# Patient Record
Sex: Male | Born: 1955 | Hispanic: No | Marital: Married | Smoking: Never smoker
Health system: Southern US, Community
[De-identification: ages and names within clinical notes are randomized; demographics above are authoritative.]

## PROBLEM LIST (undated history)

## (undated) DIAGNOSIS — Z9889 Other specified postprocedural states: Secondary | ICD-10-CM

## (undated) DIAGNOSIS — G4730 Sleep apnea, unspecified: Secondary | ICD-10-CM

## (undated) DIAGNOSIS — I10 Essential (primary) hypertension: Secondary | ICD-10-CM

## (undated) DIAGNOSIS — L80 Vitiligo: Secondary | ICD-10-CM

## (undated) DIAGNOSIS — Z9289 Personal history of other medical treatment: Secondary | ICD-10-CM

## (undated) DIAGNOSIS — Z8619 Personal history of other infectious and parasitic diseases: Secondary | ICD-10-CM

## (undated) DIAGNOSIS — T7840XA Allergy, unspecified, initial encounter: Secondary | ICD-10-CM

## (undated) HISTORY — DX: Vitiligo: L80

## (undated) HISTORY — DX: Personal history of other infectious and parasitic diseases: Z86.19

## (undated) HISTORY — DX: Other specified postprocedural states: Z98.890

## (undated) HISTORY — DX: Essential (primary) hypertension: I10

## (undated) HISTORY — PX: OTHER SURGICAL HISTORY: SHX169

## (undated) HISTORY — DX: Personal history of other medical treatment: Z92.89

## (undated) HISTORY — PX: KNEE ARTHROSCOPY: SHX127

## (undated) HISTORY — DX: Allergy, unspecified, initial encounter: T78.40XA

## (undated) HISTORY — PX: CARDIOVASCULAR STRESS TEST: SHX262

## (undated) HISTORY — DX: Sleep apnea, unspecified: G47.30

---

## 2000-10-16 ENCOUNTER — Ambulatory Visit (HOSPITAL_BASED_OUTPATIENT_CLINIC_OR_DEPARTMENT_OTHER): Admission: RE | Admit: 2000-10-16 | Discharge: 2000-10-16 | Payer: Self-pay | Admitting: Internal Medicine

## 2003-12-22 ENCOUNTER — Ambulatory Visit: Payer: Self-pay | Admitting: Internal Medicine

## 2006-06-25 ENCOUNTER — Ambulatory Visit (HOSPITAL_BASED_OUTPATIENT_CLINIC_OR_DEPARTMENT_OTHER): Admission: RE | Admit: 2006-06-25 | Discharge: 2006-06-25 | Payer: Self-pay | Admitting: Orthopedic Surgery

## 2009-01-02 HISTORY — PX: COLONOSCOPY: SHX174

## 2009-02-11 ENCOUNTER — Encounter (INDEPENDENT_AMBULATORY_CARE_PROVIDER_SITE_OTHER): Payer: Self-pay | Admitting: *Deleted

## 2009-02-12 ENCOUNTER — Ambulatory Visit: Payer: Self-pay | Admitting: Internal Medicine

## 2009-02-26 ENCOUNTER — Ambulatory Visit: Payer: Self-pay | Admitting: Internal Medicine

## 2010-02-01 NOTE — Letter (Signed)
Summary: [facility] Instructions  [facility]  [address] [place], [place] [address]   Phone: [phone]  Fax: [phone]       PASTOR SGRO    May 10, 1955    MRN: [id number]        Procedure Day Jake Delgado:  Farrell Ours  02/26/09     Arrival Time:  3:00PM     Procedure Time:  4:00PM     Location of Procedure:                    _ X_  [facility] (4th Floor)  PREPARATION FOR COLONOSCOPY WITH MOVIPREP   Starting 5 days prior to your procedure 02/21/09 do not eat nuts, seeds, popcorn, corn, beans, peas,  salads, or any raw vegetables.  Do not take any fiber supplements (e.g. Metamucil, Citrucel, and Benefiber).  THE DAY BEFORE YOUR PROCEDURE         DATE: 02/25/09 DAY: THURSDAY  1.  Drink clear liquids the entire day-NO SOLID FOOD  2.  Do not drink anything colored red or purple.  Avoid juices with pulp.  No orange juice.  3.  Drink at least 64 oz. (8 glasses) of fluid/clear liquids during the day to prevent dehydration and help the prep work efficiently.  CLEAR LIQUIDS INCLUDE: Water Jello Ice Popsicles Tea (sugar ok, no milk/cream) Powdered fruit flavored drinks Coffee (sugar ok, no milk/cream) Gatorade Juice: apple, white grape, white cranberry  Lemonade Clear bullion, consomm, broth Carbonated beverages (any kind) Strained chicken noodle soup Hard Candy                             4.  In the morning, mix first dose of MoviPrep solution:    Empty 1 Pouch A and 1 Pouch B into the disposable container    Add lukewarm drinking water to the top line of the container. Mix to dissolve    Refrigerate (mixed solution should be used within 24 hrs)  5.  Begin drinking the prep at 5:00 p.m. The MoviPrep container is divided by 4 marks.   Every 15 minutes drink the solution down to the next mark (approximately 8 oz) until the full liter is complete.   6.  Follow completed prep with 16 oz of clear liquid of your choice (Nothing red or purple).   Continue to drink clear liquids until bedtime.  7.  Before going to bed, mix second dose of MoviPrep solution:    Empty 1 Pouch A and 1 Pouch B into the disposable container    Add lukewarm drinking water to the top line of the container. Mix to dissolve    Refrigerate  THE DAY OF YOUR PROCEDURE      DATE: 02/26/09   DAY: FRIDAY  Beginning at 11:00AM (5 hours before procedure):         1. Every 15 minutes, drink the solution down to the next mark (approx 8 oz) until the full liter is complete.  2. Follow completed prep with 16 oz. of clear liquid of your choice.    3. You may drink clear liquids until 2:00PM (2 HOURS BEFORE PROCEDURE).   MEDICATION INSTRUCTIONS  Unless otherwise instructed, you should take regular prescription medications with a small sip of water   as early as possible the morning of your procedure.         OTHER INSTRUCTIONS  You will need a responsible adult at least  53 years of age to accompany you and drive you home.   This person must remain in the waiting room during your procedure.  Wear loose fitting clothing that is easily removed.  Leave jewelry and other valuables at home.  However, you may wish to bring a book to read or  an iPod/MP3 player to listen to music as you wait for your procedure to start.  Remove all body piercing jewelry and leave at home.  Total time from sign-in until discharge is approximately 2-3 hours.  You should go home directly after your procedure and rest.  You can resume normal activities the  day after your procedure.  The day of your procedure you should not:   Drive   Make legal decisions   Operate machinery   Drink alcohol   Return to work  You will receive specific instructions about eating, activities and medications before you leave.    The above instructions have been reviewed and explained to me by   Wyona Almas RN  February 12, 2009 5:00 PM     I fully understand and can verbalize  these instructions _____________________________ Date _________

## 2010-02-01 NOTE — Procedures (Signed)
Summary: Colonoscopy  Patient: Jenesis Suchy Note: All result statuses are Final unless otherwise noted.  Tests: (1) Colonoscopy (COL)   COL Colonoscopy           DONE     [facility]     [address]     [place], [place]  [address]           COLONOSCOPY PROCEDURE REPORT           PATIENT:  Shreyas, Piatkowski  MR#:  [id number]     BIRTHDATE:  [birth year], 53 yrs. old  GENDER:  male           ENDOSCOPIST:  Hedwig Morton. Juanda Chance, MD     Referred by:  Eleonore Chiquito, M.D.           PROCEDURE DATE:  02/26/2009     PROCEDURE:  Colonoscopy 96[address]     ASA CLASS:  Class I     INDICATIONS:  Routine Risk Screening           MEDICATIONS:   Versed 9 mg, Fentanyl 75 mcg           DESCRIPTION OF PROCEDURE:   After the risks benefits and     alternatives of the procedure were thoroughly explained, informed     consent was obtained.  Digital rectal exam was performed and     revealed no rectal masses.   The LB CF-[id number] endoscope     was introduced through the anus and advanced to the cecum, which     was identified by both the appendix and ileocecal valve, without     limitations.  The quality of the prep was excellent, using     MoviPrep.  The instrument was then slowly withdrawn as the colon     was fully examined.     <<PROCEDUREIMAGES>>           FINDINGS:  No polyps or cancers were seen (see image1, image2,     image3, image4, image5, and image6).   Retroflexed views in the     rectum revealed no abnormalities.    The scope was then withdrawn     from the patient and the procedure completed.           COMPLICATIONS:  None           ENDOSCOPIC IMPRESSION:     1) No polyps or cancers     2) Normal colonoscopy     RECOMMENDATIONS:     1) High fiber diet.           REPEAT EXAM:  In 10 year(s) for.           ______________________________     Hedwig Morton. Juanda Chance, MD           CC:           n.     eSIGNED:   Hedwig Morton. Brodie at 02/26/2009 05:05 PM           Darryll Capers,  [id number]  Note: An exclamation mark (!) indicates a result that was not dispersed into the flowsheet. Document Creation Date: 02/26/2009 5:05 PM _______________________________________________________________________  (1) Order result status: Final Collection or observation date-time: 02/26/2009 17:00 Requested date-time:  Receipt date-time:  Reported date-time:  Referring Physician:   Ordering Physician: Lina Sar [phone]) Specimen Source:  Source: Launa Grill Order Number: [id number] Lab site:   Appended Document: Colonoscopy    Clinical Lists Changes  Observations: Added  new observation of COLONNXTDUE: 02/2019 (02/26/2009 17:08)

## 2010-02-01 NOTE — Miscellaneous (Signed)
Summary: [facility] Previsit/prep  Clinical Lists Changes  Medications: Added new medication of MOVIPREP 100 GM  SOLR (PEG-KCL-NACL-NASULF-NA ASC-C) As per prep instructions. - Signed Rx of MOVIPREP 100 GM  SOLR (PEG-KCL-NACL-NASULF-NA ASC-C) As per prep instructions.;  #1 x 0;  Signed;  Entered by: Wyona Almas RN;  Authorized by: Hart Carwin MD;  Method used: Electron[facility] to [facility]  [phone]*, [address], [place], [place]  [address], Ph: [phone] or [phone], Fax: [phone] Allergies: Added new allergy or adverse reaction of IBUPROFEN Observations: Added new observation of NKA: F (02/12/2009 16:23)    Prescriptions: MOVIPREP 100 GM  SOLR (PEG-KCL-NACL-NASULF-NA ASC-C) As per prep instructions.  #1 x 0   Entered by:   Wyona Almas RN   Authorized by:   Hart Carwin MD   Signed by:   Wyona Almas RN on 02/12/2009   Method used:   [facility]ronically to        [phone] (retail)       [address] [place], [place]  [address]       Ph: [phone] or [phone]       Fax: [phone]   RxID:   [id number]

## 2010-05-17 NOTE — Op Note (Signed)
NAMELORANCE, PICKERAL                ACCOUNT NO.:  [id number]   MEDICAL RECORD NO.:  [id number]          PATIENT TYPE:  AMB   LOCATION:  [facility]                         FACILITY:  [facility]   PHYSICIAN:  Ollen Gross, M.D.    DATE OF BIRTH:  Aug 04, 1955   DATE OF PROCEDURE:  06/25/2006  DATE OF DISCHARGE:                               OPERATIVE REPORT   PREOPERATIVE DIAGNOSIS:  Left knee medial and lateral meniscal tears.   POSTOPERATIVE DIAGNOSIS:  Left knee medial and lateral meniscal tears.   PROCEDURE:  Left knee arthroscopy with medial and lateral meniscal  debridement.   SURGEON:  Dr. Lequita Halt, no assistant.   ANESTHESIA:  General.   BLOOD LOSS:  Minimal.   DRAIN:  None.   COMPLICATION:  None.   CONDITION:  Stable to recovery room.   CLINICAL NOTE:  Jake Delgado is a 55 year old male with close to 1-year  history of pain, left knee with significant mechanical symptoms.  Exam  and history suggest meniscal tear confirmed by MRI.  Presents now for  arthroscopy and debridement.   PROCEDURE IN DETAIL:  After successful administration of general  anesthetic, a tourniquet was placed high on the left thigh and left  lower extremity prepped and draped in the usual sterile fashion.  Standard superomedial and inferolateral incisions made. In flow cannula  passed superomedial and camera passed inferolateral.  Arthroscopic  visualization proceeds.  Undersurface of the patella and trochlea look  fine.  Medial and lateral gutters were visualized and there were no  loose bodies.  Upon getting down to the base of the medial gutter there  was a piece of meniscus that was flipped out of the joint and into the  medial gutter consistent with a bucket-handle tear.  Flexion valgus  force applied to the knee and medial compartment entered.  There was  definitely evidence of tear in the body and posterior horn of medial  meniscus with a bucket-handle fragment flipped out of the joint into the  medial  gutter.  There were no chondral defects.  Spinal needle is used  to localize the inferomedial portal, small incision made, dilator placed  and the meniscus debrided back to stable base with baskets and a 4.2 mm  shaver and sealed off with the ArthroCare device.  The chondral surfaces  are again inspected.  No defects noted.   Intercondylar notch is visualized and has a fair amount of synovitis.  ACL looks normal.  Lateral compartment entered, there is a significant  tear in the body and anterior aspect of the lateral meniscus going all  the way back to the posterior horn.  This debrided back to stable base  with baskets, 4.2 mm shaver and sealed off with the ArthroCare device.  There were no chondral defects in the lateral compartment.  The rest of  the joint again visualized.  No other tears or loose bodies noted.  The  arthroscopic equipment was removed from the inferior portals  which were closed with interrupted 4-0 nylon.  20 mL of 0.25% Marcaine  with epinephrine injected through the inflow cannula  and that is removed  and that portal closed with nylon.  Bulky sterile dressing applied and  he is awakened and transferred to recovery in stable condition.      Ollen Gross, M.D.  Electronically Signed     FA/MEDQ  D:  06/25/2006  T:  06/25/2006  Job:  [id number]

## 2010-10-19 LAB — POCT HEMOGLOBIN-HEMACUE: Hemoglobin: 16.1

## 2013-01-02 DIAGNOSIS — Z9289 Personal history of other medical treatment: Secondary | ICD-10-CM

## 2013-01-02 HISTORY — DX: Personal history of other medical treatment: Z92.89

## 2013-06-06 ENCOUNTER — Encounter (HOSPITAL_COMMUNITY): Payer: Self-pay | Admitting: Emergency Medicine

## 2013-06-06 ENCOUNTER — Emergency Department (HOSPITAL_COMMUNITY)
Admission: EM | Admit: 2013-06-06 | Discharge: 2013-06-07 | Disposition: A | Discharge status: Home / Self Care | Payer: BC Managed Care – PPO | Attending: Emergency Medicine | Admitting: Emergency Medicine

## 2013-06-06 DIAGNOSIS — S239XXA Sprain of unspecified parts of thorax, initial encounter: Secondary | ICD-10-CM | POA: Insufficient documentation

## 2013-06-06 DIAGNOSIS — X500XXA Overexertion from strenuous movement or load, initial encounter: Secondary | ICD-10-CM | POA: Insufficient documentation

## 2013-06-06 DIAGNOSIS — J159 Unspecified bacterial pneumonia: Secondary | ICD-10-CM | POA: Insufficient documentation

## 2013-06-06 DIAGNOSIS — J189 Pneumonia, unspecified organism: Secondary | ICD-10-CM

## 2013-06-06 DIAGNOSIS — Y9389 Activity, other specified: Secondary | ICD-10-CM | POA: Insufficient documentation

## 2013-06-06 DIAGNOSIS — Z79899 Other long term (current) drug therapy: Secondary | ICD-10-CM | POA: Insufficient documentation

## 2013-06-06 DIAGNOSIS — Y929 Unspecified place or not applicable: Secondary | ICD-10-CM | POA: Insufficient documentation

## 2013-06-06 DIAGNOSIS — IMO0002 Reserved for concepts with insufficient information to code with codable children: Secondary | ICD-10-CM

## 2013-06-06 MED ORDER — PREDNISONE 20 MG PO TABS: 60.0000 mg | ORAL_TABLET | Freq: Once | ORAL | Status: DC

## 2013-06-06 MED ORDER — HYDROMORPHONE HCL PF 1 MG/ML IJ SOLN
1.0000 mg | Freq: Once | INTRAMUSCULAR | Status: AC
  Administered 2013-06-06: 1 mg via INTRAVENOUS
  Filled 2013-06-06: qty 1

## 2013-06-06 MED ORDER — PREDNISONE 20 MG PO TABS
60.0000 mg | ORAL_TABLET | Freq: Once | ORAL | Status: AC
  Administered 2013-06-06: 60 mg via ORAL
  Filled 2013-06-06: qty 3

## 2013-06-06 MED ORDER — HYDROMORPHONE HCL PF 1 MG/ML IJ SOLN: 1.0000 mg | Freq: Once | INTRAMUSCULAR | Status: DC

## 2013-06-06 MED ORDER — DIAZEPAM 5 MG PO TABS
5.0000 mg | ORAL_TABLET | Freq: Once | ORAL | Status: AC
  Administered 2013-06-06: 5 mg via ORAL
  Filled 2013-06-06: qty 1

## 2013-06-06 NOTE — ED Notes (Signed)
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 Pt c/o spasms today.

## 2013-06-06 NOTE — ED Provider Notes (Signed)
CSN: [id number]     Arrival date & time 06/06/13  2224 History  This chart was scribed for Ebbie Ridge, PA working with Enid Skeens, MD by Chestine Spore, ED Scribe. The patient was seen in room WTR9/WTR9 at 11:01 PM.     Chief Complaint  Patient presents with  . Back Pain    The history is provided by a relative. No language interpreter was used.   HPI Comments: Jake Delgado is a 58 y.o. male who presents to the Emergency Department complaining of worsening left lower back pain that began 5-6 days ago. He states that he developed moderate pain after he did heavy lifting. He took [place] with no aid for his pain. He visited [facility] earlier today and had a X-ray conducted and was advised to scheduled a CT because of questionable mass.  After being seen there his pain worsened and he developed severe muscle spasms.  Pt has associated symptoms of SOB because of the muscle spasms but he denies any other SOB.   History reviewed. No pertinent past medical history.  Past Surgical History  Procedure Laterality Date  . Cardiovascular stress test    . Knee arthroscopy     No family history on file. History  Substance Use Topics  . Smoking status: Never Smoker   . Smokeless tobacco: Not on file  . Alcohol Use: No    Review of Systems A complete 10 system review of systems was obtained and all systems are negative except as noted in the HPI and PMH.     Allergies  Ansaid and Ibuprofen  Home Medications   Prior to Admission medications   Medication Sig Start Date End Date Taking? Authorizing Provider  acetaminophen (TYLENOL) 500 MG tablet Take 1,000 mg by mouth every 6 (six) hours as needed for moderate pain.   Yes Historical Provider, MD  cetirizine (ZYRTEC) 10 MG tablet Take 10 mg by mouth daily.   Yes Historical Provider, MD   BP 155/92  Pulse 82  Temp(Src) 100 F (37.8 C) (Oral)  Resp 21  SpO2 95% Physical Exam  Nursing note and vitals  reviewed. Constitutional: He is oriented to person, place, and time. He appears well-developed and well-nourished. No distress.  HENT:  Head: Normocephalic and atraumatic.  Mouth/Throat: Oropharynx is clear and moist.  Eyes: EOM are normal.  Neck: Normal range of motion. Neck supple. No tracheal deviation present.  Cardiovascular: Normal rate, regular rhythm and normal heart sounds.   No murmur heard. Pulmonary/Chest: Effort normal and breath sounds normal. No respiratory distress. He has no wheezes. He has no rales. He exhibits tenderness.  Pain on palpation to the left Thoracic and Lateral chest wall.  Abdominal: Soft. Bowel sounds are normal. He exhibits no distension. There is no tenderness.  Musculoskeletal: Normal range of motion. He exhibits no edema.  Neurological: He is alert and oriented to person, place, and time. He exhibits normal muscle tone. Coordination normal.  Skin: Skin is warm and dry. No rash noted. No erythema.  Psychiatric: He has a normal mood and affect. His behavior is normal.    ED Course  Procedures (including critical care time) DIAGNOSTIC STUDIES: Oxygen Saturation is 95% on room air, adequate by my interpretation.    COORDINATION OF CARE: 11:05 PM-Discussed treatment plan which includes CT scan of the affected area with pt at bedside and pt agreed to plan.   Labs Review Labs Reviewed  CBC WITH DIFFERENTIAL - Abnormal; Notable for the  following:    RBC 6.28 (*)    MCV 75.0 (*)    MCH 25.3 (*)    All other components within normal limits  I-STAT CHEM 8, ED - Abnormal; Notable for the following:    Glucose, Bld 173 (*)    Hemoglobin 18.0 (*)    HCT 53.0 (*)    All other components within normal limits  URINALYSIS, ROUTINE W REFLEX MICROSCOPIC    Imaging Review Ct Chest W Contrast  06/07/2013   CLINICAL DATA:  Chest pain.  Abnormal chest x-ray.  EXAM: CT CHEST WITH CONTRAST  TECHNIQUE: Multidetector CT imaging of the chest was performed during  intravenous contrast administration.  CONTRAST:  100mL OMNIPAQUE IOHEXOL 300 MG/ML  SOLN  COMPARISON:  None.  FINDINGS: The chest wall is unremarkable. No masses or adenopathy. The thyroid gland appears normal. The bony thorax is intact. No destructive bone lesions or spinal canal compromise.  The heart is normal in size. No pericardial effusion. No mediastinal or hilar mass or adenopathy. The thoracic aorta demonstrates mild tortuosity and ectasia but no focal aneurysm or dissection. No dissection. The esophagus is grossly normal.  Examination of the lung parenchyma demonstrates bilateral infiltrates, left greater than right. There are bilateral lower lobe, lingular and right middle lobe infiltrates and a small left effusion. No worrisome pulmonary lesions. The tracheobronchial tree is unremarkable.  The upper abdomen is unremarkable.  IMPRESSION: Patchy bilateral lung infiltrates.   Electronically Signed   By: Loralie ChampagneMark  Gallerani M.D.   On: 06/07/2013 01:12   Patient will be advised followup with his primary care Dr. told to return here as, needed.  We'll get pain control for further told her symptoms.  Patient also be treated for possible early pneumonia based on his CT scan the patient is advised of the results.  All questions were answered.  Patient is feeling improved at this time.  Patient is advised to use ice and heat on his back and chest.  Patient most likely has a thoracic strain with spasm, based on his findings of heavy lifting that started the pain      I personally performed the services described in this documentation, which was scribed in my presence. The recorded information has been reviewed and is accurate.   Carlyle Dollyhristopher W Molina Hollenback, PA-C 06/07/13 [phone]

## 2013-06-07 ENCOUNTER — Emergency Department (HOSPITAL_COMMUNITY): Payer: BC Managed Care – PPO

## 2013-06-07 ENCOUNTER — Ambulatory Visit (HOSPITAL_COMMUNITY): Admission: RE | Admit: 2013-06-07 | Payer: BC Managed Care – PPO | Source: Ambulatory Visit

## 2013-06-07 ENCOUNTER — Ambulatory Visit (HOSPITAL_COMMUNITY)
Admission: RE | Admit: 2013-06-07 | Discharge: 2013-06-07 | Disposition: A | Discharge status: Home / Self Care | Payer: BC Managed Care – PPO | Source: Ambulatory Visit | Attending: Emergency Medicine | Admitting: Emergency Medicine

## 2013-06-07 LAB — CBC WITH DIFFERENTIAL/PLATELET
BASOS ABS: 0 10*3/uL (ref 0.0–0.1)
Basophils Relative: 0 % (ref 0–1)
EOS PCT: 1 % (ref 0–5)
Eosinophils Absolute: 0.1 10*3/uL (ref 0.0–0.7)
HEMATOCRIT: 47.1 % (ref 39.0–52.0)
HEMOGLOBIN: 15.9 g/dL (ref 13.0–17.0)
Lymphocytes Relative: 20 % (ref 12–46)
Lymphs Abs: 1.8 10*3/uL (ref 0.7–4.0)
MCH: 25.3 pg — ABNORMAL LOW (ref 26.0–34.0)
MCHC: 33.8 g/dL (ref 30.0–36.0)
MCV: 75 fL — AB (ref 78.0–100.0)
MONO ABS: 0.8 10*3/uL (ref 0.1–1.0)
Monocytes Relative: 9 % (ref 3–12)
Neutro Abs: 6.2 10*3/uL (ref 1.7–7.7)
Neutrophils Relative %: 70 % (ref 43–77)
Platelets: 161 10*3/uL (ref 150–400)
RBC: 6.28 MIL/uL — ABNORMAL HIGH (ref 4.22–5.81)
RDW: 14.4 % (ref 11.5–15.5)
WBC: 8.9 10*3/uL (ref 4.0–10.5)

## 2013-06-07 LAB — URINALYSIS, ROUTINE W REFLEX MICROSCOPIC
Bilirubin Urine: NEGATIVE
Glucose, UA: NEGATIVE mg/dL
Hgb urine dipstick: NEGATIVE
KETONES UR: NEGATIVE mg/dL
Leukocytes, UA: NEGATIVE
NITRITE: NEGATIVE
PH: 6 (ref 5.0–8.0)
Protein, ur: NEGATIVE mg/dL
Specific Gravity, Urine: 1.021 (ref 1.005–1.030)
Urobilinogen, UA: 0.2 mg/dL (ref 0.0–1.0)

## 2013-06-07 LAB — I-STAT CHEM 8, ED
BUN: 21 mg/dL (ref 6–23)
Calcium, Ion: 1.18 mmol/L (ref 1.12–1.23)
Chloride: 99 mEq/L (ref 96–112)
Creatinine, Ser: 1 mg/dL (ref 0.50–1.35)
Glucose, Bld: 173 mg/dL — ABNORMAL HIGH (ref 70–99)
HCT: 53 % — ABNORMAL HIGH (ref 39.0–52.0)
Hemoglobin: 18 g/dL — ABNORMAL HIGH (ref 13.0–17.0)
Potassium: 4.2 mEq/L (ref 3.7–5.3)
Sodium: 140 mEq/L (ref 137–147)
TCO2: 24 mmol/L (ref 0–100)

## 2013-06-07 MED ORDER — IOHEXOL 300 MG/ML  SOLN
100.0000 mL | Freq: Once | INTRAMUSCULAR | Status: AC | PRN
  Administered 2013-06-07: 100 mL via INTRAVENOUS

## 2013-06-07 MED ORDER — PREDNISONE 50 MG PO TABS: 50.0000 mg | ORAL_TABLET | Freq: Every day | ORAL | Status: DC

## 2013-06-07 MED ORDER — DOXYCYCLINE HYCLATE 100 MG PO CAPS: 100.0000 mg | ORAL_CAPSULE | Freq: Two times a day (BID) | ORAL | Status: DC

## 2013-06-07 MED ORDER — DIAZEPAM 5 MG PO TABS: 5.0000 mg | ORAL_TABLET | Freq: Three times a day (TID) | ORAL | Status: DC | PRN

## 2013-06-07 MED ORDER — OXYCODONE-ACETAMINOPHEN 5-325 MG PO TABS: 1.0000 | ORAL_TABLET | ORAL | Status: DC | PRN

## 2013-06-07 NOTE — ED Notes (Signed)
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  Became real severe today with very limited movement.

## 2013-06-07 NOTE — ED Provider Notes (Signed)
Medical screening examination/treatment/procedure(s) were conducted as a shared visit with non-physician practitioner(s) or resident and myself. I personally evaluated the patient during the encounter and agree with the findings and plan unless otherwise indicated.  I have personally reviewed any xrays and/ or EKG's with the provider and I agree with interpretation.  Patient with no significant medical history, recent normal stress test presents with worsening left flank pain and back spasms for the past 5 or 6 days. Patient was lifting heavy computer equipment prior to starting however has gradually worsened since.Patient denies urinary or bowel changes, active cancer, extremity weakness, IVDU, fevers, immunosuppression or significant trauma. Worse with palpation any range of motion. The deep left flank pain. Patient has tried over-the-counter medicines with minimal improvement. Worse with walking. On exam patient has tenderness in the left flank paraspinal location however unable to reproduce pain. Muscles are tight paraspinal. Patient has 5+ and lower extremities bilateral with flexion extension of hips knees and great toes. Normal lower extremity reflexes. Normal sensation lower exam is bilateral. With worsening symptoms indeed flank pain plan for CT look for stone, AAA or other etiology. Pain medicines and urine.   CT chest reviewed and bilateral lower infiltrates. Patient denies significant cough however this may explain his flank pain for pneumonia. I called the patient back to check on how is doing he does feel improved. I did asked him to come back to the ER to have an ultrasound or CT scan of the abdomen to ensure no abdominal aortic aneurysm. The wife said that she would bring him back later to complete the workup to rule out abdominal aortic aneurysm. Left flank pain, back pain   Enid Skeens, MD 06/07/13 [phone]

## 2013-06-07 NOTE — ED Provider Notes (Addendum)
Pt back for u/s or CT of abd to assess for AAA per Dr Jodi Mourning to complete his workup from last night.  Denies any pain, vomiting.  Feeling much better than last night, will check u/s as outpatient order rather than checking him back in to ED as he is asymptomatic now.  16:17: no evidence of AAA, aorta at upper limits of normal, spoke with pt's wife and advised that he needs f/u u/s in about 5 years per recommendations.   Rolan Bucco, MD 06/07/13 1057  Rolan Bucco, MD 06/07/13 [phone]

## 2013-06-07 NOTE — ED Notes (Signed)
Patient transported to CT 

## 2013-06-07 NOTE — Discharge Instructions (Signed)
Return here as needed.  Followup with a primary care Dr. or urgent care.  Use ice and heat on your back & side.

## 2013-07-09 ENCOUNTER — Encounter: Payer: Self-pay | Admitting: Internal Medicine

## 2013-07-09 ENCOUNTER — Ambulatory Visit (INDEPENDENT_AMBULATORY_CARE_PROVIDER_SITE_OTHER): Payer: BC Managed Care – PPO | Admitting: Internal Medicine

## 2013-07-09 ENCOUNTER — Ambulatory Visit (INDEPENDENT_AMBULATORY_CARE_PROVIDER_SITE_OTHER)
Admission: RE | Admit: 2013-07-09 | Discharge: 2013-07-09 | Disposition: A | Discharge status: Home / Self Care | Payer: BC Managed Care – PPO | Source: Ambulatory Visit | Attending: Internal Medicine | Admitting: Internal Medicine

## 2013-07-09 ENCOUNTER — Other Ambulatory Visit (INDEPENDENT_AMBULATORY_CARE_PROVIDER_SITE_OTHER): Payer: BC Managed Care – PPO

## 2013-07-09 VITALS — BP 130/80 | HR 54 | Temp 97.7°F | Ht 70.0 in | Wt 205.0 lb

## 2013-07-09 DIAGNOSIS — R918 Other nonspecific abnormal finding of lung field: Secondary | ICD-10-CM

## 2013-07-09 LAB — SEDIMENTATION RATE: Sed Rate: 4 mm/hr (ref 0–22)

## 2013-07-09 NOTE — Assessment & Plan Note (Signed)
With esr 4 and all acute symptoms gone I assume this was some form of CAP rx with doxy/ pred and better with ddx to include BOOP though note this is not assoc with CP,  Perhaps this was mscp unrelated to L cp though seems like quite a coincidence  He also has new doe so needs f/u with pft's and an apples to apples comparison to prev cxr when he was acutely ill  See instructions for specific recommendations which were reviewed directly with the patient who was given a copy with highlighter outlining the key components.

## 2013-07-09 NOTE — Progress Notes (Signed)
Quick Note:  Spoke with pt and notified of results per Dr. Wert. Pt verbalized understanding and denied any questions.  ______ 

## 2013-07-09 NOTE — Patient Instructions (Signed)
Please remember to go to the lab and x-ray department downstairs for your tests - we will call you with the results when they are available.     Please schedule a follow up office visit in 4 weeks, sooner if needed with pfts and Chest xray from [facility]

## 2013-07-09 NOTE — Progress Notes (Signed)
Subjective:     Patient ID: Jake CapersJasbir Scarantino, male   DOB: 06-07-1955  MRN: [id number]  HPI  58 yo [place] Male never smoker  in [place] eval with cxr for mscp on L abruptly after lifting computer late May > to UC at [facility] with "abn cxr" then to ER feeling even worse > Abn CT 06/06/13 >rx doxy and pred> self referred to pulmonary clinic 07/09/2013 as has no primary.   07/09/2013 1st [facility] office visit/ Eamon Tantillo  Chief Complaint  Patient presents with  . Pulmonary Consult    Self referral for eval of abnormal ct chest. Pt states that he had some CP in May 2015 after he pulled a muscle and then cxr was done. He c/o DOE for the past 2 months- gets SOB when running approx 1 mile.     cp gone p prednisone but usually can run x 5 miles and starting  summer 2014 only could run a mile and back to baseline p rx.   No obvious day to day or daytime variabilty or assoc chronic cough or cp or chest tightness, subjective wheeze overt sinus or hb symptoms. No unusual exp hx or h/o childhood pna/ asthma or knowledge of premature birth.  Sleeping ok without nocturnal  or early am exacerbation  of respiratory  c/o's or need for noct saba. Also denies any obvious fluctuation of symptoms with weather or environmental changes or other aggravating or alleviating factors except as outlined above   Current Medications, Allergies, Complete Past Medical History, Past Surgical History, Family History, and Social History were reviewed in [facility] electronic medical record.  ROS  The following are not active complaints unless bolded sore throat, dysphagia, dental problems, itching, sneezing,  nasal congestion or excess/ purulent secretions, ear ache,   fever, chills, sweats, unintended wt loss, pleuritic or exertional cp, hemoptysis,  orthopnea pnd or leg swelling, presyncope, palpitations, heartburn, abdominal pain, anorexia, nausea, vomiting, diarrhea  or change in bowel or urinary habits, change in stools or urine,  dysuria,hematuria,  rash, arthralgias, visual complaints, headache, numbness weakness or ataxia or problems with walking or coordination,  change in mood/affect or memory.         Review of Systems     Objective:   Physical Exam    amb [place] male nad  Wt Readings from Last 3 Encounters:  07/09/13 205 lb (92.987 kg)      HEENT: nl dentition, turbinates, and orophanx. Nl external ear canals without cough reflex   NECK :  without JVD/Nodes/TM/ nl carotid upstrokes bilaterally   LUNGS: no acc muscle use, clear to A and P bilaterally without cough on insp or exp maneuvers   CV:  RRR  no s3 or murmur or increase in P2, no edema   ABD:  soft and nontender with nl excursion in the supine position. No bruits or organomegaly, bowel sounds nl  MS:  warm without deformities, calf tenderness, cyanosis or clubbing  SKIN: warm and dry without lesions    NEURO:  alert, approp, no deficits    CXR  07/09/2013 : Clearing of lower lobe infiltrates with minimal linear opacity remaining anteriorly in the lingula or right middle lobe.     Lab Results  Component Value Date   ESRSEDRATE 4 07/09/2013      Assessment:

## 2013-07-10 NOTE — Progress Notes (Signed)
Quick Note:  Spoke with pt and notified of results per Dr. Wert. Pt verbalized understanding and denied any questions.  ______ 

## 2013-08-06 ENCOUNTER — Ambulatory Visit (INDEPENDENT_AMBULATORY_CARE_PROVIDER_SITE_OTHER): Payer: BC Managed Care – PPO | Admitting: Internal Medicine

## 2013-08-06 ENCOUNTER — Encounter: Payer: Self-pay | Admitting: Internal Medicine

## 2013-08-06 ENCOUNTER — Ambulatory Visit (INDEPENDENT_AMBULATORY_CARE_PROVIDER_SITE_OTHER)
Admission: RE | Admit: 2013-08-06 | Discharge: 2013-08-06 | Disposition: A | Discharge status: Home / Self Care | Payer: BC Managed Care – PPO | Source: Ambulatory Visit | Attending: Internal Medicine | Admitting: Internal Medicine

## 2013-08-06 VITALS — BP 140/80 | HR 60 | Temp 99.0°F | Ht 70.0 in | Wt 209.0 lb

## 2013-08-06 DIAGNOSIS — R0989 Other specified symptoms and signs involving the circulatory and respiratory systems: Secondary | ICD-10-CM

## 2013-08-06 DIAGNOSIS — R918 Other nonspecific abnormal finding of lung field: Secondary | ICD-10-CM

## 2013-08-06 DIAGNOSIS — R0600 Dyspnea, unspecified: Secondary | ICD-10-CM

## 2013-08-06 DIAGNOSIS — R0609 Other forms of dyspnea: Secondary | ICD-10-CM

## 2013-08-06 NOTE — Patient Instructions (Signed)
Try prilosec 20mg   Take 30-60 min before first meal of the day and Pepcid 20 mg one bedtime  X 2 weeks   GERD (REFLUX)  is an extremely common cause of respiratory symptoms, many times with no significant heartburn at all.    It can be treated with medication, but also with lifestyle changes including avoidance of late meals, excessive alcohol, smoking cessation, and avoid fatty foods, chocolate, peppermint, colas, red wine, and acidic juices such as orange juice.  NO MINT OR MENTHOL PRODUCTS SO NO COUGH DROPS  USE SUGARLESS CANDY INSTEAD (jolley ranchers or Stover's)  NO OIL BASED VITAMINS - use powdered substitutes.   Please see patient coordinator before you leave today  to schedule cpst with spirometry before and after in 2 weeks no sooner

## 2013-08-06 NOTE — Assessment & Plan Note (Signed)
-  esr 07/09/2013 = 4  - f/u cxr 08/06/2013 clear in LLL/ Lingula  No further f/u needed

## 2013-08-06 NOTE — Progress Notes (Signed)
Subjective:     Patient ID: Jake Delgado, male   DOB: 01-21-1955  MRN: [id number]    Brief patient profile:  58 yo [place] Male never smoker  [place] eval with cxr for [facility] on L abruptly after lifting computer late May > to UC at [facility] with "abn cxr" then to ER feeling even worse > Abn CT 06/06/13 >rx doxy and pred> self referred to pulmonary clinic 07/09/2013 as has no primary.   History of Present Illness  07/09/2013 1st [facility] office visit/ Wert  Chief Complaint  Patient presents with  . Pulmonary Consult    Self referral for eval of abnormal ct chest. Pt states that he had some CP in May 2015 after he pulled a muscle and then cxr was done. He c/o DOE for the past 2 months- gets SOB when running approx 1 mile.   cp gone p prednisone but usually can run x 5 miles and starting  summer 2014 only could run a mile and back to running a mile now but this is not his original baseline  rec F/u cxr x 4 weeks    08/06/2013 f/u ov/[facility] re: f/u infiltrates  Chief Complaint  Patient presents with  . Followup with CXR    Pt states that his breathing is unchanged since last visit. No new co's today.     Can only run 2 miles then starts wheezing- def not baseline, but otherwise no symptoms   No obvious day to day or daytime variabilty or assoc chronic cough or cp or chest tightness, subjective wheeze overt sinus or hb symptoms. No unusual exp hx or h/o childhood pna/ asthma or knowledge of premature birth.  Sleeping ok without nocturnal  or early am exacerbation  of respiratory  c/o's or need for noct saba. Also denies any obvious fluctuation of symptoms with weather or environmental changes or other aggravating or alleviating factors except as outlined above   Current Medications, Allergies, Complete Past Medical History, Past Surgical History, Family History, and Social History were reviewed in [facility] electronic medical record.  ROS  The following are not active complaints unless  bolded sore throat, dysphagia, dental problems, itching, sneezing,  nasal congestion or excess/ purulent secretions, ear ache,   fever, chills, sweats, unintended wt loss, pleuritic or exertional cp, hemoptysis,  orthopnea pnd or leg swelling, presyncope, palpitations, heartburn, abdominal pain, anorexia, nausea, vomiting, diarrhea  or change in bowel or urinary habits, change in stools or urine, dysuria,hematuria,  rash, arthralgias, visual complaints, headache, numbness weakness or ataxia or problems with walking or coordination,  change in mood/affect or memory.               Objective:   Physical Exam    amb [place] male nad  Wt Readings from Last 3 Encounters:  08/06/13 209 lb (94.802 kg)  07/09/13 205 lb (92.987 kg)         HEENT: nl dentition, turbinates, and orophanx. Nl external ear canals without cough reflex   NECK :  without JVD/Nodes/TM/ nl carotid upstrokes bilaterally   LUNGS: no acc muscle use, clear to A and P bilaterally without cough on insp or exp maneuvers   CV:  RRR  no s3 or murmur or increase in P2, no edema   ABD:  soft and nontender with nl excursion in the supine position. No bruits or organomegaly, bowel sounds nl  MS:  warm without deformities, calf tenderness, cyanosis or clubbing  SKIN: warm and  dry without lesions    NEURO:  alert, approp, no deficits    CXR  07/09/2013 : Clearing of lower lobe infiltrates with minimal linear opacity remaining anteriorly in the lingula or right middle lobe.     Lab Results  Component Value Date   ESRSEDRATE 4 07/09/2013      Assessment:

## 2013-08-06 NOTE — Assessment & Plan Note (Signed)
Symptoms are markedly disproportionate to objective findings and not clear this is a lung problem but pt does appear to have difficult airway management issues. DDX of  difficult airways management all start with A and  include Adherence, Ace Inhibitors, Acid Reflux, Active Sinus Disease, Alpha 1 Antitripsin deficiency, Anxiety masquerading as Airways dz,  ABPA,  allergy(esp in young), Aspiration (esp in elderly), Adverse effects of DPI,  Active smokers, plus two Bs  = Bronchiectasis and Beta blocker use..and one C= CHF  ? Acid (or non-acid) GERD > always difficult to exclude as up to 75% of pts in some series report no assoc GI/ Heartburn symptoms> rec max (24h)  acid suppression and diet restrictions/ reviewed and instructions given in writing.   ? Allergy/asthma > do cpst p 2 weeks on gerd rx with spirometry before and after  ? chf / cardiac > neg w/u by Nadara EatonGangi in May 2015 per pt

## 2013-08-20 ENCOUNTER — Ambulatory Visit (HOSPITAL_COMMUNITY): Payer: BC Managed Care – PPO | Attending: Internal Medicine

## 2013-08-20 DIAGNOSIS — R0600 Dyspnea, unspecified: Secondary | ICD-10-CM

## 2013-08-20 DIAGNOSIS — R0609 Other forms of dyspnea: Secondary | ICD-10-CM | POA: Diagnosis present

## 2013-08-20 DIAGNOSIS — R0989 Other specified symptoms and signs involving the circulatory and respiratory systems: Secondary | ICD-10-CM | POA: Diagnosis not present

## 2013-08-21 ENCOUNTER — Encounter: Payer: Self-pay | Admitting: Internal Medicine

## 2013-08-21 DIAGNOSIS — R0602 Shortness of breath: Secondary | ICD-10-CM

## 2013-08-22 ENCOUNTER — Telehealth: Payer: Self-pay | Admitting: Internal Medicine

## 2013-08-22 NOTE — Telephone Encounter (Signed)
Result Note     Call patient : Study is unremarkable, no change in recs> needs ov to regroup if still having symptoms as this study shows completely nl ex performance with no asthma - send copy to primary  --  lmomtcb x1

## 2013-08-22 NOTE — Telephone Encounter (Signed)
Pt calling to give # where he can be reached [phone] cell.Caren GriffinsStanley A Dalton

## 2013-08-25 NOTE — Telephone Encounter (Signed)
Pt is returning call & can be reached at [phone].  Jake Delgado

## 2013-08-25 NOTE — Telephone Encounter (Signed)
lmtcb x1 

## 2013-08-25 NOTE — Telephone Encounter (Signed)
Pt has returned call. Please call his cell [phone]

## 2013-08-25 NOTE — Telephone Encounter (Signed)
Spoke with pt and notified of results per Dr. Sherene Sires. Pt verbalized understanding and denied any questions. Pt reports doing much better and will f/u prn

## 2013-10-09 ENCOUNTER — Encounter: Payer: Self-pay | Admitting: Internal Medicine

## 2013-11-11 ENCOUNTER — Ambulatory Visit (INDEPENDENT_AMBULATORY_CARE_PROVIDER_SITE_OTHER): Payer: BC Managed Care – PPO | Admitting: Internal Medicine

## 2013-11-11 ENCOUNTER — Encounter: Payer: Self-pay | Admitting: Internal Medicine

## 2013-11-11 VITALS — BP 140/94 | Temp 98.9°F | Ht 69.5 in | Wt 205.9 lb

## 2013-11-11 DIAGNOSIS — L80 Vitiligo: Secondary | ICD-10-CM

## 2013-11-11 DIAGNOSIS — R7309 Other abnormal glucose: Secondary | ICD-10-CM

## 2013-11-11 DIAGNOSIS — Z833 Family history of diabetes mellitus: Secondary | ICD-10-CM

## 2013-11-11 DIAGNOSIS — R030 Elevated blood-pressure reading, without diagnosis of hypertension: Secondary | ICD-10-CM

## 2013-11-11 DIAGNOSIS — R739 Hyperglycemia, unspecified: Secondary | ICD-10-CM

## 2013-11-11 NOTE — Progress Notes (Signed)
Pre visit review using our clinic review tool, if applicable. No additional management support is needed unless otherwise documented below in the visit note.  Chief Complaint  Patient presents with  . Establish Care    HPI: Patient  Jake Delgado is 58 y.o. comes in today for new patient  Care visit . Previous care has been through various specialists and hasn't really had a PCP. He is of [place] descent [place]. Has seen Dr. Talmage NapBalan endocrinologist when his mother developed complications of severe diabetes. His A1c was noted to be elevated  6.7 2014 in the early diabetic range but he intervened with intensive lifestyle changes diet and exercise and his levels have come down to the prediabetic range.  He sees endocrine on a yearly basis now. Last a1c was NOv 4 5.6  In addition he had notably a BUN of 24 and a creatinine of 1.1 which concerned his wife who is a physician. No history of renal disease excess anti-inflammatories hypertension and this specimen was fasting. Also no history of GI bleeding. Urine microalb raitos a x 2 have been normal.  Cholesterol has been in favorable range  He had an evaluation in the past because of a questionable abnormal chest x-ray is included evaluation by pulmonology Dr. Sherene SiresWert and a cardiovascularluminary stress test that showed no active worrisome disease. These tests resultsare available in the electronic record.  Health Maintenance  Topic Date Due  . TETANUS/TDAP  11/26/1976  . INFLUENZA VACCINE  08/02/2013  . COLONOSCOPY  02/27/2019   Health Maintenance Review LIFESTYLE:  Exercise:  yes Tobacco/ETS:no Alcohol: per day no Sugar beverages:no Sleep:6 hours  Drug use: no Colonoscopy: 2 25 2011   ROS:  GEN/ HEENT: No fever, significant weight changes sweats headaches vision problems hearing changes, CV/ PULM; No chest pain shortness of breath cough, syncope,edema  change in exercise tolerance. GI /GU: No adominal pain,  vomiting, change in bowel habits. No blood in the stool. No significant GU symptoms. SKIN/HEME: ,no acute skin rashes suspicious lesions or bleeding. No lymphadenopathy, nodules, masses. Has had Vitiligo no fam hx  Onset young adult teen b12 and folate ok  NEURO/ PSYCH:  No neurologic signs such as weakness numbness. No depression anxiety. IMM/ Allergy: No unusual infections.  Allergy .   REST of 12 system review negative except as per HPI   Past Medical History  Diagnosis Date  . Vitiligo     when younger no treatment doesn't run in families.  . History of varicella   . H/O left knee surgery     MCL 2008 Dr. Despina HickAlusio    Family History  Problem Relation Age of Onset  . Hypertension Mother   . Diabetes Mellitus II Mother   . Heart disease      maternal uncle in his 5860s    History   Social History  . Marital Status: Married    Spouse Name: N/A    Number of Children: N/A  . Years of Education: N/A   Occupational History  . Surveyor, mineralsComputer Engineer    Social History Main Topics  . Smoking status: Never Smoker   . Smokeless tobacco: Never Used  . Alcohol Use: No  . Drug Use: No  . Sexual Activity: None   Other Topics Concern  . 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   Give ETS FA   Vegetarian but  Pos dairly and sometimes meat    Outpatient Encounter Prescriptions as of 11/11/2013  Medication Sig  . cetirizine (ZYRTEC) 10 MG tablet Take 10 mg by mouth daily.    EXAM:  BP 140/94 mmHg  Temp(Src) 98.9 F (37.2 C) (Oral)  Ht 5' 9.5" (1.765 m)  Wt 205 lb 14.4 oz (93.396 kg)  BMI 29.98 kg/m2  Body mass index is 29.98 kg/(m^2).  Physical Exam: Vital signs reviewed QMV:HQIO is a well-developed well-nourished alert cooperative    who appearsr  stated age in no acute distress.  HEENT: normocephalic atraumatic , Eyes: PERRL EOM's full, conjunctiva clear, Nares: paten,t no deformity discharge or tenderness., Ears: no deformity EAC's clear TMs with normal landmarks. Mouth: clear OP, no lesions, edema.  Moist mucous membranes. Dentition in adequate repair. NECK: supple without masses, thyromegaly or bruits. CHEST/PULM:  Clear to auscultation and percussion breath sounds equal no wheeze , rales or rhonchi. No chest wall deformities or tenderness. CV: PMI is nondisplaced, S1 S2 no gallops, murmurs, rubs. Peripheral pulses are full without delay.No JVD .  ABDOMEN: Bowel sounds normal nontender  No guard or rebound, no hepato splenomegal no CVA tenderness.  No hernia. Extremtities:  No clubbing cyanosis or edema, no acute joint swelling or redness no focal atrophy NEURO:  Oriented x3, cranial nerves 3-12 appear to be intact, no obvious focal weakness,gait within normal limits no abnormal reflexes or asymmetrical SKIN: No acute rashes normal turgor, color, no bruising or petechiae. PSYCH: Oriented, good eye contact, no obvious depression anxiety, cognition and judgment appear normal. LN: no cervical axillary inguinal adenopathy  Lab Results  Component Value Date   WBC 8.9 06/07/2013   HGB 15.9 06/07/2013   HCT 47.1 06/07/2013   PLT 161 06/07/2013   GLUCOSE 173* 06/07/2013   NA 140 06/07/2013   K 4.2 06/07/2013   CL 99 06/07/2013   CREATININE 1.00 06/07/2013   BUN 21 06/07/2013  b12 was 393 2014 ([id number])  Vit d 29.3 nl tsh borderline low t4 0.89  ASSESSMENT AND PLAN:  Discussed the following assessment and plan:  Elevated blood sugar - controlled lsi   Elevated blood pressure reading - reported as normal but needs rx if dcoumneted elevated  will follow cr 1. range bun borderline urine prot normal.  Vitiligo - no autoimmunie disease reported  neg fam hx  Family history of diabetes mellitus  Patient Care Team: Madelin Headings, MD  as PCP - General (Internal Medicine) Nyoka Cowden, MD as Consulting Physician (Pulmonary Disease) Dorisann Frames, MD as Consulting Physician (Endocrinology) Patient Instructions  Continue lifestyle intervention healthy eating and exercise .  Take blood pressure readings twice a day for 7- 10 days and then periodically .To ensure below 140/90    Bring in monitor and readings  In about 1-2 months . Plan to recheck BMP  non fasting     The BUN elevation may have been from mild dehydration levels.   DASH Eating Plan DASH stands for "Dietary Approaches to Stop Hypertension." The DASH eating plan is a healthy eating plan that has been shown to reduce high blood pressure (hypertension). Additional health benefits may include reducing the risk of type 2 diabetes mellitus, heart disease, and stroke. The DASH eating plan may also help with weight loss. WHAT DO I  NEED TO KNOW ABOUT THE DASH EATING PLAN? For the DASH eating plan, you will follow these general guidelines:  Choose foods with a percent daily value for sodium of less than 5% (as listed on the food label).  Use salt-free seasonings or herbs instead of table salt or sea salt.  Check with your health care provider or pharmacist before using salt substitutes.  Eat lower-sodium products, often labeled as "lower sodium" or "no salt added."  Eat fresh foods.  Eat more vegetables, fruits, and low-fat dairy products.  Choose whole grains. Look for the word "whole" as the first word in the ingredient list.  Choose fish and skinless chicken or Mal[place]key more often than red meat. Limit fish, poultry, and meat to 6 oz (170 g) each day.  Limit sweets, desserts, sugars, and sugary drinks.  Choose heart-healthy fats.  Limit cheese to 1 oz (28 g) per day.  Eat more home-cooked food and less restaurant, buffet, and fast food.  Limit fried foods.  Cook foods using methods other than frying.  Limit canned vegetables. If you do use them,  rinse them well to decrease the sodium.  When eating at a restaurant, ask that your food be prepared with less salt, or no salt if possible. WHAT FOODS CAN I EAT? Seek help from a dietitian for individual calorie needs. Grains Whole grain or whole wheat bread. Brown rice. Whole grain or whole wheat pasta. Quinoa, bulgur, and whole grain cereals. Low-sodium cereals. Corn or whole wheat flour tortillas. Whole grain cornbread. Whole grain crackers. Low-sodium crackers. Vegetables Fresh or frozen vegetables (raw, steamed, roasted, or grilled). Low-sodium or reduced-sodium tomato and vegetable juices. Low-sodium or reduced-sodium tomato sauce and paste. Low-sodium or reduced-sodium canned vegetables.  Fruits All fresh, canned (in natural juice), or frozen fruits. Meat and Other Protein Products Ground beef (85% or leaner), grass-fed beef, or beef trimmed of fat. Skinless chicken or Mal[place]key. Ground chicken or [place]key. Pork trimmed of fat. All fish and seafood. Eggs. Dried beans, peas, or lentils. Unsalted nuts and seeds. Unsalted canned beans. Dairy Low-fat dairy products, such as skim or 1% milk, 2% or reduced-fat cheeses, low-fat ricotta or cottage cheese, or plain low-fat yogurt. Low-sodium or reduced-sodium cheeses. Fats and Oils Tub margarines without trans fats. Light or reduced-fat mayonnaise and salad dressings (reduced sodium). Avocado. Safflower, olive, or canola oils. Natural peanut or almond butter. Other Unsalted popcorn and pretzels. The items listed above may not be a complete list of recommended foods or beverages. Contact your dietitian for more options. WHAT FOODS ARE NOT RECOMMENDED? Grains White bread. White pasta. White rice. Refined cornbread. Bagels and croissants. Crackers that contain trans fat. Vegetables Creamed or fried vegetables. Vegetables in a cheese sauce. Regular canned vegetables. Regular canned tomato sauce and paste. Regular tomato and vegetable  juices. Fruits Dried fruits. Canned fruit in light or heavy syrup. Fruit juice. Meat and Other Protein Products Fatty cuts of meat. Ribs, chicken wings, bacon, sausage, bologna, salami, chitterlings, fatback, hot dogs, bratwurst, and packaged luncheon meats. Salted nuts and seeds. Canned beans with salt. Dairy Whole or 2% milk, cream, half-and-half, and cream cheese. Whole-fat or sweetened yogurt. Full-fat cheeses or blue cheese. Nondairy creamers and whipped toppings. Processed cheese, cheese spreads, or cheese curds. Condiments Onion and garlic salt, seasoned salt, table salt, and sea salt. Canned and packaged gravies. Worcestershire sauce. Tartar sauce. Barbecue sauce. Teriyaki sauce. Soy sauce, including reduced sodium. Steak sauce. Fish sauce. Oyster sauce. Cocktail sauce. Horseradish. Ketchup and mustard. Meat flavorings  and tenderizers. Bouillon cubes. Hot sauce. Tabasco sauce. Marinades. Taco seasonings. Relishes. Fats and Oils Butter, stick margarine, lard, shortening, ghee, and bacon fat. Coconut, palm kernel, or palm oils. Regular salad dressings. Other Pickles and olives. Salted popcorn and pretzels. The items listed above may not be a complete list of foods and beverages to avoid. Contact your dietitian for more information. WHERE CAN I FIND MORE INFORMATION? National Heart, Lung, and Blood Institute: CablePromo.it Document Released: 12/08/2010 Document Revised: 05/05/2013 Document Reviewed: 10/23/2012 [facility] Patient Information 2015 [place], [place]. This information is not intended to replace advice given to you by your health care provider. Make sure you discuss any questions you have with your health care provider.      Neta Mends. Jake Delgado M.D.

## 2013-11-11 NOTE — Patient Instructions (Signed)
Continue lifestyle intervention healthy eating and exercise .  Take blood pressure readings twice a day for 7- 10 days and then periodically .To ensure below 140/90    Bring in monitor and readings  In about 1-2 months . Plan to recheck BMP  non fasting     The BUN elevation may have been from mild dehydration levels.   DASH Eating Plan DASH stands for "Dietary Approaches to Stop Hypertension." The DASH eating plan is a healthy eating plan that has been shown to reduce high blood pressure (hypertension). Additional health benefits may include reducing the risk of type 2 diabetes mellitus, heart disease, and stroke. The DASH eating plan may also help with weight loss. WHAT DO I NEED TO KNOW ABOUT THE DASH EATING PLAN? For the DASH eating plan, you will follow these general guidelines:  Choose foods with a percent daily value for sodium of less than 5% (as listed on the food label).  Use salt-free seasonings or herbs instead of table salt or sea salt.  Check with your health care provider or pharmacist before using salt substitutes.  Eat lower-sodium products, often labeled as "lower sodium" or "no salt added."  Eat fresh foods.  Eat more vegetables, fruits, and low-fat dairy products.  Choose whole grains. Look for the word "whole" as the first word in the ingredient list.  Choose fish and skinless chicken or Mal[place]key more often than red meat. Limit fish, poultry, and meat to 6 oz (170 g) each day.  Limit sweets, desserts, sugars, and sugary drinks.  Choose heart-healthy fats.  Limit cheese to 1 oz (28 g) per day.  Eat more home-cooked food and less restaurant, buffet, and fast food.  Limit fried foods.  Cook foods using methods other than frying.  Limit canned vegetables. If you do use them, rinse them well to decrease the sodium.  When eating at a restaurant, ask that your food be prepared with less salt, or no salt if possible. WHAT FOODS CAN I EAT? Seek help from a  dietitian for individual calorie needs. Grains Whole grain or whole wheat bread. Brown rice. Whole grain or whole wheat pasta. Quinoa, bulgur, and whole grain cereals. Low-sodium cereals. Corn or whole wheat flour tortillas. Whole grain cornbread. Whole grain crackers. Low-sodium crackers. Vegetables Fresh or frozen vegetables (raw, steamed, roasted, or grilled). Low-sodium or reduced-sodium tomato and vegetable juices. Low-sodium or reduced-sodium tomato sauce and paste. Low-sodium or reduced-sodium canned vegetables.  Fruits All fresh, canned (in natural juice), or frozen fruits. Meat and Other Protein Products Ground beef (85% or leaner), grass-fed beef, or beef trimmed of fat. Skinless chicken or Mal[place]urkey. Ground chicken or [place]key. Pork trimmed of fat. All fish and seafood. Eggs. Dried beans, peas, or lentils. Unsalted nuts and seeds. Unsalted canned beans. Dairy Low-fat dairy products, such as skim or 1% milk, 2% or reduced-fat cheeses, low-fat ricotta or cottage cheese, or plain low-fat yogurt. Low-sodium or reduced-sodium cheeses. Fats and Oils Tub margarines without trans fats. Light or reduced-fat mayonnaise and salad dressings (reduced sodium). Avocado. Safflower, olive, or canola oils. Natural peanut or almond butter. Other Unsalted popcorn and pretzels. The items listed above may not be a complete list of recommended foods or beverages. Contact your dietitian for more options. WHAT FOODS ARE NOT RECOMMENDED? Grains White bread. White pasta. White rice. Refined cornbread. Bagels and croissants. Crackers that contain trans fat. Vegetables Creamed or fried vegetables. Vegetables in a cheese sauce. Regular canned vegetables. Regular canned tomato sauce and paste. Regular  tomato and vegetable juices. Fruits Dried fruits. Canned fruit in light or heavy syrup. Fruit juice. Meat and Other Protein Products Fatty cuts of meat. Ribs, chicken wings, bacon, sausage, bologna, salami,  chitterlings, fatback, hot dogs, bratwurst, and packaged luncheon meats. Salted nuts and seeds. Canned beans with salt. Dairy Whole or 2% milk, cream, half-and-half, and cream cheese. Whole-fat or sweetened yogurt. Full-fat cheeses or blue cheese. Nondairy creamers and whipped toppings. Processed cheese, cheese spreads, or cheese curds. Condiments Onion and garlic salt, seasoned salt, table salt, and sea salt. Canned and packaged gravies. Worcestershire sauce. Tartar sauce. Barbecue sauce. Teriyaki sauce. Soy sauce, including reduced sodium. Steak sauce. Fish sauce. Oyster sauce. Cocktail sauce. Horseradish. Ketchup and mustard. Meat flavorings and tenderizers. Bouillon cubes. Hot sauce. Tabasco sauce. Marinades. Taco seasonings. Relishes. Fats and Oils Butter, stick margarine, lard, shortening, ghee, and bacon fat. Coconut, palm kernel, or palm oils. Regular salad dressings. Other Pickles and olives. Salted popcorn and pretzels. The items listed above may not be a complete list of foods and beverages to avoid. Contact your dietitian for more information. WHERE CAN I FIND MORE INFORMATION? National Heart, Lung, and Blood Institute: CablePromo.itwww.nhlbi.nih.gov/health/health-topics/topics/dash/ Document Released: 12/08/2010 Document Revised: 05/05/2013 Document Reviewed: 10/23/2012 [facility] Patient Information 2015 [place], [place]. This information is not intended to replace advice given to you by your health care provider. Make sure you discuss any questions you have with your health care provider.

## 2013-11-16 ENCOUNTER — Encounter: Payer: Self-pay | Admitting: Internal Medicine

## 2013-11-16 DIAGNOSIS — R030 Elevated blood-pressure reading, without diagnosis of hypertension: Secondary | ICD-10-CM | POA: Insufficient documentation

## 2013-11-16 DIAGNOSIS — L80 Vitiligo: Secondary | ICD-10-CM | POA: Insufficient documentation

## 2013-11-16 DIAGNOSIS — R739 Hyperglycemia, unspecified: Secondary | ICD-10-CM | POA: Insufficient documentation

## 2014-01-06 ENCOUNTER — Other Ambulatory Visit (INDEPENDENT_AMBULATORY_CARE_PROVIDER_SITE_OTHER): Payer: BC Managed Care – PPO

## 2014-01-06 DIAGNOSIS — R7309 Other abnormal glucose: Secondary | ICD-10-CM

## 2014-01-06 DIAGNOSIS — R739 Hyperglycemia, unspecified: Secondary | ICD-10-CM

## 2014-01-06 LAB — BASIC METABOLIC PANEL
BUN: 23 mg/dL (ref 6–23)
CO2: 26 mEq/L (ref 19–32)
Calcium: 9 mg/dL (ref 8.4–10.5)
Chloride: 104 mEq/L (ref 96–112)
Creatinine, Ser: 1 mg/dL (ref 0.4–1.5)
GFR: 85.06 mL/min (ref >60.00)
Glucose, Bld: 95 mg/dL (ref 70–99)
POTASSIUM: 4.4 meq/L (ref 3.5–5.1)
SODIUM: 139 meq/L (ref 135–145)

## 2014-01-06 LAB — VITAMIN B12: Vitamin B-12: 384 pg/mL (ref 211–911)

## 2014-01-13 ENCOUNTER — Ambulatory Visit (INDEPENDENT_AMBULATORY_CARE_PROVIDER_SITE_OTHER): Payer: BLUE CROSS/BLUE SHIELD | Admitting: Internal Medicine

## 2014-01-13 ENCOUNTER — Encounter: Payer: Self-pay | Admitting: Internal Medicine

## 2014-01-13 VITALS — BP 138/90 | HR 56 | Temp 97.9°F | Wt 202.8 lb

## 2014-01-13 DIAGNOSIS — R030 Elevated blood-pressure reading, without diagnosis of hypertension: Secondary | ICD-10-CM

## 2014-01-13 DIAGNOSIS — R739 Hyperglycemia, unspecified: Secondary | ICD-10-CM

## 2014-01-13 DIAGNOSIS — Z833 Family history of diabetes mellitus: Secondary | ICD-10-CM

## 2014-01-13 DIAGNOSIS — R7309 Other abnormal glucose: Secondary | ICD-10-CM

## 2014-01-13 DIAGNOSIS — Z23 Encounter for immunization: Secondary | ICD-10-CM

## 2014-01-13 DIAGNOSIS — Z2821 Immunization not carried out because of patient refusal: Secondary | ICD-10-CM

## 2014-01-13 NOTE — Patient Instructions (Signed)
Continue   bp monitoring once a month and   Contact  us of not in range .  Schedule  .  Wellness visit with labs and hga1c . And PSA.  Continue lifestyle intervention healthy eating and exercise . And cross training    Exercise.

## 2014-01-13 NOTE — Progress Notes (Signed)
Chief Complaint  Patient presents with  . Follow-up    BP  labs     HPI: Jake Delgado 59 y.o. comes in for fu labs and BP management delineation.   Presents spread sheet of BP readings and pulse and brings in his monitor to day to check.  31 readings   All but 2 in range 11- - 130s   elevated 164/112 at [place] [facility] office  otheriwise one 144/78 ROS: See pertinent positives and negatives per HPI.  Past Medical History  Diagnosis Date  . Vitiligo     when younger no treatment doesn't run in families.  . History of varicella   . H/O left knee surgery     MCL 2008 Dr. Despina HickAlusio    Family History  Problem Relation Age of Onset  . Hypertension Mother   . Diabetes Mellitus II Mother   . Heart disease      maternal uncle in his 5260s    History   Social History  . Marital Status: Married    Spouse Name: N/A    Number of Children: N/A  . Years of Education: N/A   Occupational History  . Surveyor, mineralsComputer Engineer    Social History Main Topics  . Smoking status: Never Smoker   . Smokeless tobacco: Never Used  . Alcohol Use: No  . Drug Use: No  . Sexual Activity: None   Other Topics Concern  . 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   Give ETS FA   Vegetarian but  Pos dairly and sometimes meat    Outpatient Encounter Prescriptions as of 01/13/2014  Medication Sig  . cetirizine (ZYRTEC) 10 MG tablet Take 10 mg by mouth daily.    EXAM:  BP 138/90 mmHg  Pulse 56  Temp(Src) 97.9 F (36.6 C) (Oral)  Wt 202 lb 12.8 oz (91.989 kg)  SpO2 97%  Body mass index is 29.53 kg/(m^2).  GENERAL: vitals reviewed and listed above, alert, oriented, appears well hydrated and in no acute distress HEENT: atraumatic, conjunctiva  clear, no obvious abnormalities on  inspection of external nose and ears MS: moves all extremities without noticeable focal  abnormality PSYCH: pleasant and cooperative, no obvious depression or anxiety Data  reviewed  And  advised Lab Results  Component Value Date   WBC 8.9 06/07/2013   HGB 15.9 06/07/2013   HCT 47.1 06/07/2013   PLT 161 06/07/2013   GLUCOSE 95 01/06/2014   NA 139 01/06/2014   K 4.4 01/06/2014   CL 104 01/06/2014   CREATININE 1.0 01/06/2014   BUN 23 01/06/2014   CO2 26 01/06/2014   BP Readings from Last 3 Encounters:  01/13/14 138/90  11/11/13 140/94  08/06/13 140/80   Wt Readings from Last 3 Encounters:  01/13/14 202 lb 12.8 oz (91.989 kg)  11/11/13 205 lb 14.4 oz (93.396 kg)  08/06/13 209 lb (94.802 kg)   Pt machine 138/86 pulse 56 and ours 138/90  b12 normal  ASSESSMENT AND PLAN:  Discussed the following assessment and plan:  Elevated blood pressure reading - self monitoring accurate enough for clinical decision making ;currently at goal   Elevated blood sugar - controlled lsi  Family history of diabetes mellitus  Influenza vaccination declined  Need for Tdap vaccination - Plan: Tdap vaccine greater than or equal to 7yo IM  -Patient advised to return or notify health care team  if symptoms worsen ,persist or new concerns arise.  Patient Instructions  Continue   bp monitoring once a month and   Contact  us of not in range .  Schedule  .  Wellness visit with labs and hga1c . And PSA.  Continue lifestyle intervention healthy eating and exercise . And cross training    Exercise.    Neta Mends. Khaden Gater M.D.

## 2014-01-18 DIAGNOSIS — Z833 Family history of diabetes mellitus: Secondary | ICD-10-CM | POA: Insufficient documentation

## 2014-07-21 ENCOUNTER — Other Ambulatory Visit (INDEPENDENT_AMBULATORY_CARE_PROVIDER_SITE_OTHER): Payer: BLUE CROSS/BLUE SHIELD

## 2014-07-21 DIAGNOSIS — Z0000 Encounter for general adult medical examination without abnormal findings: Secondary | ICD-10-CM | POA: Diagnosis not present

## 2014-07-21 LAB — HEPATIC FUNCTION PANEL
ALK PHOS: 70 U/L (ref 39–117)
ALT: 20 U/L (ref 0–53)
AST: 18 U/L (ref 0–37)
Albumin: 4 g/dL (ref 3.5–5.2)
BILIRUBIN DIRECT: 0.1 mg/dL (ref 0.0–0.3)
BILIRUBIN TOTAL: 0.8 mg/dL (ref 0.2–1.2)
Total Protein: 6.6 g/dL (ref 6.0–8.3)

## 2014-07-21 LAB — BASIC METABOLIC PANEL
BUN: 22 mg/dL (ref 6–23)
CALCIUM: 9.1 mg/dL (ref 8.4–10.5)
CO2: 30 mEq/L (ref 19–32)
Chloride: 103 mEq/L (ref 96–112)
Creatinine, Ser: 0.95 mg/dL (ref 0.40–1.50)
GFR: 86.96 mL/min (ref >60.00)
Glucose, Bld: 90 mg/dL (ref 70–99)
Potassium: 4.6 mEq/L (ref 3.5–5.1)
Sodium: 141 mEq/L (ref 135–145)

## 2014-07-21 LAB — LIPID PANEL
CHOLESTEROL: 157 mg/dL (ref 0–200)
HDL: 42.7 mg/dL (ref >39.00)
LDL Cholesterol: 88 mg/dL (ref 0–99)
NONHDL: 114.3
TRIGLYCERIDES: 134 mg/dL (ref 0.0–149.0)
Total CHOL/HDL Ratio: 4
VLDL: 26.8 mg/dL (ref 0.0–40.0)

## 2014-07-21 LAB — CBC WITH DIFFERENTIAL/PLATELET
BASOS PCT: 0.3 % (ref 0.0–3.0)
Basophils Absolute: 0 10*3/uL (ref 0.0–0.1)
EOS ABS: 0.2 10*3/uL (ref 0.0–0.7)
EOS PCT: 3 % (ref 0.0–5.0)
HCT: 47.5 % (ref 39.0–52.0)
HEMOGLOBIN: 15.3 g/dL (ref 13.0–17.0)
LYMPHS ABS: 1.7 10*3/uL (ref 0.7–4.0)
Lymphocytes Relative: 30.9 % (ref 12.0–46.0)
MCHC: 32.1 g/dL (ref 30.0–36.0)
MCV: 79.4 fl (ref 78.0–100.0)
Monocytes Absolute: 0.7 10*3/uL (ref 0.1–1.0)
Monocytes Relative: 12.3 % — ABNORMAL HIGH (ref 3.0–12.0)
NEUTROS PCT: 53.5 % (ref 43.0–77.0)
Neutro Abs: 3 10*3/uL (ref 1.4–7.7)
PLATELETS: 159 10*3/uL (ref 150.0–400.0)
RBC: 5.99 Mil/uL — AB (ref 4.22–5.81)
RDW: 14.7 % (ref 11.5–15.5)
WBC: 5.6 10*3/uL (ref 4.0–10.5)

## 2014-07-21 LAB — TSH: TSH: 0.87 u[IU]/mL (ref 0.35–4.50)

## 2014-07-21 LAB — HEMOGLOBIN A1C: HEMOGLOBIN A1C: 5.7 % (ref 4.6–6.5)

## 2014-07-21 LAB — PSA: PSA: 0.9 ng/mL (ref 0.10–4.00)

## 2014-07-28 ENCOUNTER — Encounter: Payer: Self-pay | Admitting: Internal Medicine

## 2014-07-28 ENCOUNTER — Ambulatory Visit (INDEPENDENT_AMBULATORY_CARE_PROVIDER_SITE_OTHER): Payer: BLUE CROSS/BLUE SHIELD | Admitting: Internal Medicine

## 2014-07-28 VITALS — BP 122/80 | Temp 98.7°F | Ht 70.0 in | Wt 202.9 lb

## 2014-07-28 DIAGNOSIS — Z0000 Encounter for general adult medical examination without abnormal findings: Secondary | ICD-10-CM

## 2014-07-28 DIAGNOSIS — R7301 Impaired fasting glucose: Secondary | ICD-10-CM

## 2014-07-28 NOTE — Patient Instructions (Signed)
Continue lifestyle intervention healthy eating and exercise . Healthy lifestyle includes : At least 150 minutes of exercise weeks  , weight at healthy levels, which is usually   BMI 19-25. Avoid trans fats and processed foods;  Increase fresh fruits and veges to 5 servings per day. And avoid sweet beverages including tea and juice. Mediterranean diet with olive oil and nuts have been noted to be heart and brain healthy . Avoid tobacco products . Limit  alcohol to  7 per week for women and 14 servings for men.  Get adequate sleep . Wear seat belts . Don't text and drive .    

## 2014-07-28 NOTE — Progress Notes (Signed)
Pre visit review using our clinic review tool, if applicable. No additional management support is needed unless otherwise documented below in the visit note.  Chief Complaint  Patient presents with  . Annual Exam    HPI: Patient  Jake Delgado  59 y.o. 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 vision hearing  No nuero sx.  Here for preventive visit   Health Maintenance  Topic Date Due  . HIV Screening  11/26/1970  . INFLUENZA VACCINE  01/14/2015 (Originally 08/03/2014)  . COLONOSCOPY  02/27/2019  . TETANUS/TDAP  01/14/2024   Health Maintenance Review LIFESTYLE:  Exercise:  Yes Tobacco/ETS:no Alcohol: ocass per month Sugar beverages:no Sleep:5-6 hours  Drug use: no   ROS:  GEN/ HEENT: No fever, significant weight changes sweats headaches vision problems hearing changes, CV/ PULM; No chest pain shortness of breath cough, syncope,edema  change in exercise tolerance. GI /GU: No adominal pain, vomiting, change in bowel habits. No blood in the stool. No significant GU symptoms. Does have frequency  For a while  Years no obstructive sx  Hematuria  SKIN/HEME: ,no acute skin rashes suspicious lesions or bleeding. Has vitiligo  No lymphadenopathy, nodules, masses.  NEURO/ PSYCH:  No neurologic signs such as weakness numbness. No depression anxiety. IMM/ Allergy: No unusual infections.  Allergy .   REST of 12 system review negative except as per HPI   Past Medical History  Diagnosis Date  . Vitiligo     when younger no treatment doesn't run in families.  . History of varicella   . H/O left knee surgery     MCL 2008 Dr. Despina Hick  . History of exercise stress test 2015    exercise cardiopulmonary  test    Past Surgical History  Procedure Laterality Date  . Cardiovascular stress test    . Knee arthroscopy      Family History  Problem Relation Age of Onset  .  Hypertension Mother   . Diabetes Mellitus II Mother   . Heart disease      maternal uncle in his 1s   Neg  prostate cancer  History   Social History  . Marital Status: Married    Spouse Name: N/A  . Number of Children: N/A  . Years of Education: N/A   Occupational History  . Surveyor, minerals    Social History Main Topics  . Smoking status: Never Smoker   . Smokeless tobacco: Never Used  . Alcohol Use: No  . Drug Use: No  . Sexual Activity: Not on file   Other Topics Concern  . 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   Give ETS FA   Vegetarian but  Pos dairly and sometimes meat    Outpatient Prescriptions Prior to Visit  Medication Sig Dispense Refill  . cetirizine (ZYRTEC) 10 MG tablet Take 10 mg by mouth daily.     No facility-administered medications prior to visit.     EXAM:  BP 122/80 mmHg  Temp(Src) 98.7 F (37.1 C) (Oral)  Ht 5\' 10"  (1.778 m)  Wt 202 lb 14.4 oz (92.035 kg)  BMI 29.11 kg/m2  Body mass index is 29.11 kg/(m^2).  Physical Exam: Vital signs reviewed WGN:FAOZ is a well-developed well-nourished alert cooperative    who appears stated age in no acute distress.  HEENT: normocephalic atraumatic , Eyes: PERRL EOM's full, conjunctiva clear, glasses Nares: paten,t no deformity discharge or tenderness., Ears: no deformity EAC's clear TMs with normal landmarks. Mouth: clear OP, no lesions, edema.  Moist mucous membranes. Dentition in adequate repair. NECK: supple without masses, thyromegaly or bruits. CHEST/PULM:  Clear to auscultation and percussion breath sounds equal no wheeze , rales or rhonchi. No chest wall deformities or tenderness. CV: PMI is nondisplaced, S1 S2 no gallops, murmurs, rubs. Peripheral pulses are full  without delay.No JVD .  ABDOMEN: Bowel sounds normal nontender  No guard or rebound, no hepato splenomegal no CVA tenderness.  No hernia. Extremtities:  No clubbing cyanosis or edema, no acute joint swelling or redness no focal atrophy NEURO:  Oriented x3, cranial nerves 3-12 appear to be intact, no obvious focal weakness,gait within normal limits no abnormal reflexes or asymmetrical SKIN: No acute rashes  Vitiligo  Scattered   Otherwise  normal turgor,  no bruising or petechiae. Rectal prostate 1 + no nodules  Nl consistency  PSYCH: Oriented, good eye contact, no obvious depression anxiety, cognition and judgment appear normal. LN: no cervical axillary inguinal adenopathy  Lab Results  Component Value Date   WBC 5.6 07/21/2014   HGB 15.3 07/21/2014   HCT 47.5 07/21/2014   PLT 159.0 07/21/2014   GLUCOSE 90 07/21/2014   CHOL 157 07/21/2014   TRIG 134.0 07/21/2014   HDL 42.70 07/21/2014   LDLCALC 88 07/21/2014   ALT 20 07/21/2014   AST 18 07/21/2014   NA 141 07/21/2014   K 4.6 07/21/2014   CL 103 07/21/2014   CREATININE 0.95 07/21/2014   BUN 22 07/21/2014   CO2 30 07/21/2014   TSH 0.87 07/21/2014   PSA 0.90 07/21/2014   HGBA1C 5.7 07/21/2014   BP Readings from Last 3 Encounters:  07/28/14 122/80  01/13/14 138/90  11/11/13 140/94   Wt Readings from Last 3 Encounters:  07/28/14 202 lb 14.4 oz (92.035 kg)  01/13/14 202 lb 12.8 oz (91.989 kg)  11/11/13 205 lb 14.4 oz (93.396 kg)      ASSESSMENT AND PLAN:  Discussed the following assessment and plan:  Visit for preventive health examination  Fasting hyperglycemia hx  - resolved controlled   doing well   Patient Care Team: Madelin Headings, MD as PCP - General (Internal Medicine) Nyoka Cowden, MD as Consulting Physician (Pulmonary Disease) Dorisann Frames, MD as Consulting Physician (Endocrinology) Patient Instructions   Continue lifestyle intervention healthy eating and exercise . Healthy lifestyle includes : At  least 150 minutes of exercise weeks  , weight at healthy levels, which is usually   BMI 19-25. Avoid trans fats and processed foods;  Increase fresh fruits and veges to 5 servings per day. And avoid sweet beverages including tea and juice. Mediterranean diet with olive oil and nuts have been noted to be heart and brain healthy . Avoid tobacco products . Limit  alcohol to  7 per week for women and 14 servings for men.  Get adequate sleep . Wear seat belts . Don't text and drive .     Jake Delgado. Jake Delgado M.D.

## 2015-07-27 ENCOUNTER — Other Ambulatory Visit (INDEPENDENT_AMBULATORY_CARE_PROVIDER_SITE_OTHER): Payer: BLUE CROSS/BLUE SHIELD

## 2015-07-27 DIAGNOSIS — Z0000 Encounter for general adult medical examination without abnormal findings: Secondary | ICD-10-CM

## 2015-07-27 LAB — CBC WITH DIFFERENTIAL/PLATELET
BASOS ABS: 0 10*3/uL (ref 0.0–0.1)
Basophils Relative: 0.5 % (ref 0.0–3.0)
Eosinophils Absolute: 0.2 10*3/uL (ref 0.0–0.7)
Eosinophils Relative: 2.9 % (ref 0.0–5.0)
HEMATOCRIT: 46.6 % (ref 39.0–52.0)
HEMOGLOBIN: 15 g/dL (ref 13.0–17.0)
LYMPHS PCT: 28.8 % (ref 12.0–46.0)
Lymphs Abs: 1.8 10*3/uL (ref 0.7–4.0)
MCHC: 32.2 g/dL (ref 30.0–36.0)
MCV: 77.7 fl — ABNORMAL LOW (ref 78.0–100.0)
MONO ABS: 0.7 10*3/uL (ref 0.1–1.0)
Monocytes Relative: 10.8 % (ref 3.0–12.0)
Neutro Abs: 3.6 10*3/uL (ref 1.4–7.7)
Neutrophils Relative %: 57 % (ref 43.0–77.0)
PLATELETS: 143 10*3/uL — AB (ref 150.0–400.0)
RBC: 5.99 Mil/uL — ABNORMAL HIGH (ref 4.22–5.81)
RDW: 14.7 % (ref 11.5–15.5)
WBC: 6.3 10*3/uL (ref 4.0–10.5)

## 2015-07-27 LAB — HEPATIC FUNCTION PANEL
ALK PHOS: 70 U/L (ref 39–117)
ALT: 25 U/L (ref 0–53)
AST: 20 U/L (ref 0–37)
Albumin: 3.9 g/dL (ref 3.5–5.2)
BILIRUBIN DIRECT: 0.1 mg/dL (ref 0.0–0.3)
BILIRUBIN TOTAL: 0.8 mg/dL (ref 0.2–1.2)
TOTAL PROTEIN: 6.4 g/dL (ref 6.0–8.3)

## 2015-07-27 LAB — LIPID PANEL
CHOL/HDL RATIO: 4
Cholesterol: 151 mg/dL (ref 0–200)
HDL: 42.5 mg/dL (ref >39.00)
LDL CALC: 92 mg/dL (ref 0–99)
NONHDL: 108.74
TRIGLYCERIDES: 83 mg/dL (ref 0.0–149.0)
VLDL: 16.6 mg/dL (ref 0.0–40.0)

## 2015-07-27 LAB — TSH: TSH: 1.19 u[IU]/mL (ref 0.35–4.50)

## 2015-07-27 LAB — BASIC METABOLIC PANEL
BUN: 20 mg/dL (ref 6–23)
CALCIUM: 8.9 mg/dL (ref 8.4–10.5)
CO2: 29 mEq/L (ref 19–32)
CREATININE: 1 mg/dL (ref 0.40–1.50)
Chloride: 102 mEq/L (ref 96–112)
GFR: 81.67 mL/min (ref >60.00)
Glucose, Bld: 95 mg/dL (ref 70–99)
Potassium: 4.2 mEq/L (ref 3.5–5.1)
Sodium: 141 mEq/L (ref 135–145)

## 2015-07-27 LAB — PSA: PSA: 1.2 ng/mL (ref 0.10–4.00)

## 2015-08-02 NOTE — Progress Notes (Signed)
Pre visit review using our clinic review tool, if applicable. No additional management support is needed unless otherwise documented below in the visit note.  Chief Complaint  Patient presents with  . Annual Exam    HPI: Patient  Jake Delgado  60 y.o. comes in today for Preventive Health Care visit  Off metformin as lsi helped bg in nondiabetec or prediabetic range  Dose best possible with weight  Lowest weight  attainged 195 Sees dr [name] once a year   Health Maintenance  Topic Date Due  . Hepatitis C Screening  06-Dec-1955  . HIV Screening  11/26/1972  . INFLUENZA VACCINE  08/03/2015  . COLONOSCOPY  02/27/2019  . TETANUS/TDAP  01/14/2024   Health Maintenance Review LIFESTYLE:  Exercise:  yes Tobacco/ETS:n Alcohol:  n Sugar beverages:rare Sleep: interrupted cause of international business  4-7  Drug use: no HH of 2     ROS:  GEN/ HEENT: No fever, significant weight changes sweats headaches vision problems hearing changes, CV/ PULM; No chest pain shortness of breath cough, syncope,edema  change in exercise tolerance. GI /GU: No adominal pain, vomiting, change in bowel habits. No blood in the stool. No significant GU symptoms. SKIN/HEME: ,no acute skin rashes suspicious lesions or bleeding. No lymphadenopathy, nodules, masses.  NEURO/ PSYCH:  No neurologic signs such as weakness numbness. No depression anxiety. IMM/ Allergy: No unusual infections.  Allergy .   REST of 12 system review negative except as per HPI   Past Medical History:  Diagnosis Date  . H/O left knee surgery    MCL 2008 Dr. Despina Hick  . History of exercise stress test 2015   exercise cardiopulmonary  test  . History of varicella   . Vitiligo    when younger no treatment doesn't run in families.    Past Surgical History:  Procedure Laterality Date  . CARDIOVASCULAR STRESS TEST    . KNEE ARTHROSCOPY      Family History  Problem Relation Age of Onset  . Hypertension Mother   . Diabetes  Mellitus II Mother   . Heart disease      maternal uncle in his 101s    Social History   Social History  . Marital status: Married    Spouse name: N/A  . Number of children: N/A  . Years of education: N/A   Occupational History  . Surveyor, minerals    Social History Main Topics  . Smoking status: Never Smoker  . Smokeless tobacco: Never Used  . Alcohol use No  . Drug use: No  . Sexual activity: Not Asked   Other Topics Concern  . 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   Give ETS FA   Vegetarian but  Pos dairly and sometimes meat    Outpatient Medications Prior to Visit  Medication Sig Dispense Refill  . cetirizine (ZYRTEC) 10 MG tablet Take 10 mg by mouth daily.    . metFORMIN (GLUCOPHAGE-XR) 500 MG 24 hr tablet TAKE 1 TABLET EVERY DAY WITH EVENING MEAL  6   No facility-administered medications prior to visit.      EXAM:  BP 134/80 (BP Location: Left Arm, Patient Position: Sitting,  Cuff Size: Large)   Temp 98.4 F (36.9 C) (Oral)   Ht 5' 9.5" (1.765 m)   Wt 208 lb 12.8 oz (94.7 kg)   BMI 30.39 kg/m   Body mass index is 30.39 kg/m.  Physical Exam: Vital signs reviewed [id number] is a well-developed well-nourished alert cooperative    who appearsr stated age in no acute distress.  HEENT: normocephalic atraumatic , Eyes: PERRL EOM's full, conjunctiva clear, Nares: paten,t no deformity discharge or tenderness., Ears: no deformity EAC's clear TMs with normal landmarks. Mouth: clear OP, no lesions, edema.  Moist mucous membranes. Dentition in adequate repair. NECK: supple without masses, thyromegaly or bruits. CHEST/PULM:  Clear to auscultation and percussion breath sounds equal no wheeze , rales or rhonchi. No  chest wall deformities or tenderness. CV: PMI is nondisplaced, S1 S2 no gallops, murmurs, rubs. Peripheral pulses are full without delay.No JVD .  ABDOMEN: Bowel sounds normal nontender  No guard or rebound, no hepato splenomegal no CVA tenderness.  No hernia. Extremtities:  No clubbing cyanosis or edema, no acute joint swelling or redness no focal atrophy NEURO:  Oriented x3, cranial nerves 3-12 appear to be intact, no obvious focal weakness,gait within normal limits no abnormal reflexes or asymmetrical SKIN: No acute rashes normal turgor, color, no bruising or petechiae.  Vitiligo changes  Throughout  PSYCH: Oriented, good eye contact, no obvious depression anxiety, cognition and judgment appear normal. rectal prostate: no nodules or masses   LN: no cervical axillary inguinal adenopathy  Lab Results  Component Value Date   WBC 6.3 07/27/2015   HGB 15.0 07/27/2015   HCT 46.6 07/27/2015   PLT 143.0 (L) 07/27/2015   GLUCOSE 95 07/27/2015   CHOL 151 07/27/2015   TRIG 83.0 07/27/2015   HDL 42.50 07/27/2015   LDLCALC 92 07/27/2015   ALT 25 07/27/2015   AST 20 07/27/2015   NA 141 07/27/2015   K 4.2 07/27/2015   CL 102 07/27/2015   CREATININE 1.00 07/27/2015   BUN 20 07/27/2015   CO2 29 07/27/2015   TSH 1.19 07/27/2015   PSA 1.20 07/27/2015   HGBA1C 5.7 07/21/2014   BP Readings from Last 3 Encounters:  08/03/15 134/80  07/28/14 122/80  01/13/14 138/90   Wt Readings from Last 3 Encounters:  08/03/15 208 lb 12.8 oz (94.7 kg)  07/28/14 202 lb 14.4 oz (92 kg)  01/13/14 202 lb 12.8 oz (92 kg)    ASSESSMENT AND PLAN:  Discussed the following assessment and plan:  Visit for preventive health examination - doing much better lsi off meds   Family history of diabetes mellitus  Fasting hyperglycemia hx   low platelet count (HCC) - prob insig  nl exam  no sx  recheck in 1-3 months or as needed - Plan: CBC with Differential/Platelet, CANCELED: CBC with Differential/Platelet  Need  for hepatitis C screening test - Plan: Hepatitis C antibody, CANCELED: Hepatitis C antibody  Screening for HIV (human immunodeficiency virus) - Plan: HIV antibody, CANCELED: HIV antibody \] Patient Care Team: Madelin Headings, MD as PCP - General (Internal Medicine) Nyoka Cowden, MD as Consulting Physician (Pulmonary Disease) Dorisann Frames, MD as Consulting Physician (Endocrinology) [name], MD as Consulting Physician (Dermatology) Patient Instructions  Borderline low platelet  prob  Not clinically significant  Continue  Attention to lifestyle intervention healthy eating and exercise .  Recheck cbc diff platelets    And do hiv aby  and hep C screen  If all ok then yearly check .  Health Maintenance, Male A healthy lifestyle and preventative care can promote health and wellness.  Maintain regular health, dental, and eye exams.  Eat a healthy diet. Foods like vegetables, fruits, whole grains, low-fat dairy products, and lean protein foods contain the nutrients you need and are low in calories. Decrease your intake of foods high in solid fats, added sugars, and salt. Get information about a proper diet from your health care provider, if necessary.  Regular physical exercise is one of the most important things you can do for your health. Most adults should get at least 150 minutes of moderate-intensity exercise (any activity that increases your heart rate and causes you to sweat) each week. In addition, most adults need muscle-strengthening exercises on 2 or more days a week.   Maintain a healthy weight. The body mass index (BMI) is a screening tool to identify possible weight problems. It provides an estimate of body fat based on height and weight. Your health care provider can find your BMI and can help you achieve or maintain a healthy weight. For males 20 years and older:  A BMI below 18.5 is considered underweight.  A BMI of 18.5 to 24.9 is normal.  A BMI of 25 to 29.9 is  considered overweight.  A BMI of 30 and above is considered obese.  Maintain normal blood lipids and cholesterol by exercising and minimizing your intake of saturated fat. Eat a balanced diet with plenty of fruits and vegetables. Blood tests for lipids and cholesterol should begin at age 32 and be repeated every 5 years. If your lipid or cholesterol levels are high, you are over age 38, or you are at high risk for heart disease, you may need your cholesterol levels checked more frequently.Ongoing high lipid and cholesterol levels should be treated with medicines if diet and exercise are not working.  If you smoke, find out from your health care provider how to quit. If you do not use tobacco, do not start.  Lung cancer screening is recommended for adults aged 55-80 years who are at high risk for developing lung cancer because of a history of smoking. A yearly low-dose CT scan of the lungs is recommended for people who have at least a 30-pack-year history of smoking and are current smokers or have quit within the past 15 years. A pack year of smoking is smoking an average of 1 pack of cigarettes a day for 1 year (for example, a 30-pack-year history of smoking could mean smoking 1 pack a day for 30 years or 2 packs a day for 15 years). Yearly screening should continue until the smoker has stopped smoking for at least 15 years. Yearly screening should be stopped for people who develop a health problem that would prevent them from having lung cancer treatment.  If you choose to drink alcohol, do not have more than 2 drinks per day. One drink is considered to be 12 oz (360 mL) of beer, 5 oz (150 mL) of wine, or 1.5 oz (45 mL) of liquor.  Avoid the use of street drugs. Do not share needles with anyone. Ask for help if you need support or instructions about stopping the use of drugs.  High blood pressure causes heart disease and increases the risk of stroke. High blood pressure is more likely to develop  in:  People who have blood pressure in the end of the normal range (100-139/85-89 mm Hg).  People who are overweight or obese.  People who are African American.  If you are 18-28 years of age, have your blood pressure checked every 3-5 years. If you are 30 years of age or older, have your blood pressure checked every year. You should have your blood pressure measured twice--once when you are at a hospital or clinic, and once when you are not at a hospital or clinic. Record the average of the two measurements. To check your blood pressure when you are not at a hospital or clinic, you can use:  An automated blood pressure machine at a pharmacy.  A home blood pressure monitor.  If you are 21-88 years old, ask your health care provider if you should take aspirin to prevent heart disease.  Diabetes screening involves taking a blood sample to check your fasting blood sugar level. This should be done once every 3 years after age 17 if you are at a normal weight and without risk factors for diabetes. Testing should be considered at a younger age or be carried out more frequently if you are overweight and have at least 1 risk factor for diabetes.  Colorectal cancer can be detected and often prevented. Most routine colorectal cancer screening begins at the age of 6 and continues through age 43. However, your health care provider may recommend screening at an earlier age if you have risk factors for colon cancer. On a yearly basis, your health care provider may provide home test kits to check for hidden blood in the stool. A small camera at the end of a tube may be used to directly examine the colon (sigmoidoscopy or colonoscopy) to detect the earliest forms of colorectal cancer. Talk to your health care provider about this at age 13 when routine screening begins. A direct exam of the colon should be repeated every 5-10 years through age 40, unless early forms of precancerous polyps or small growths are  found.  People who are at an increased risk for hepatitis B should be screened for this virus. You are considered at high risk for hepatitis B if:  You were born in a country where hepatitis B occurs often. Talk with your health care provider about which countries are considered high risk.  Your parents were born in a high-risk country and you have not received a shot to protect against hepatitis B (hepatitis B vaccine).  You have HIV or AIDS.  You use needles to inject street drugs.  You live with, or have sex with, someone who has hepatitis B.  You are a man who has sex with other men (MSM).  You get hemodialysis treatment.  You take certain medicines for conditions like cancer, organ transplantation, and autoimmune conditions.  Hepatitis C blood testing is recommended for all people born from [birth year] through 1963 and any individual with known risk factors for hepatitis C.  Healthy men should no longer receive prostate-specific antigen (PSA) blood tests as part of routine cancer screening. Talk to your health care provider about prostate cancer screening.  Testicular cancer screening is not recommended for adolescents or adult males who have no symptoms. Screening includes self-exam, a health care provider exam, and other screening tests. Consult with your health care provider about any symptoms you have or any concerns you have about testicular cancer.  Practice safe sex. Use condoms and avoid high-risk sexual practices to reduce the spread of sexually transmitted infections (STIs).  You should be screened for STIs, including gonorrhea and chlamydia if:  You are sexually active and are younger than 24 years.  You are  older than 24 years, and your health care provider tells you that you are at risk for this type of infection.  Your sexual activity has changed since you were last screened, and you are at an increased risk for chlamydia or gonorrhea. Ask your health care provider if  you are at risk.  If you are at risk of being infected with HIV, it is recommended that you take a prescription medicine daily to prevent HIV infection. This is called pre-exposure prophylaxis (PrEP). You are considered at risk if:  You are a man who has sex with other men (MSM).  You are a heterosexual man who is sexually active with multiple partners.  You take drugs by injection.  You are sexually active with a partner who has HIV.  Talk with your health care provider about whether you are at high risk of being infected with HIV. If you choose to begin PrEP, you should first be tested for HIV. You should then be tested every 3 months for as long as you are taking PrEP.  Use sunscreen. Apply sunscreen liberally and repeatedly throughout the day. You should seek shade when your shadow is shorter than you. Protect yourself by wearing long sleeves, pants, a wide-brimmed hat, and sunglasses year round whenever you are outdoors.  Tell your health care provider of new moles or changes in moles, especially if there is a change in shape or color. Also, tell your health care provider if a mole is larger than the size of a pencil eraser.  A one-time screening for abdominal aortic aneurysm (AAA) and surgical repair of large AAAs by ultrasound is recommended for men aged 65-75 years who are current or former smokers.  Stay current with your vaccines (immunizations).   This information is not intended to replace advice given to you by your health care provider. Make sure you discuss any questions you have with your health care provider.   Document Released: 06/17/2007 Document Revised: 01/09/2014 Document Reviewed: 05/16/2010 Elsevier Interactive Patient Education [employer]     [place]. Rayshell Goecke M.D.

## 2015-08-03 ENCOUNTER — Ambulatory Visit (INDEPENDENT_AMBULATORY_CARE_PROVIDER_SITE_OTHER): Payer: BLUE CROSS/BLUE SHIELD | Admitting: Internal Medicine

## 2015-08-03 ENCOUNTER — Encounter: Payer: Self-pay | Admitting: Internal Medicine

## 2015-08-03 VITALS — BP 134/80 | Temp 98.4°F | Ht 69.5 in | Wt 208.8 lb

## 2015-08-03 DIAGNOSIS — R7301 Impaired fasting glucose: Secondary | ICD-10-CM | POA: Diagnosis not present

## 2015-08-03 DIAGNOSIS — Z0000 Encounter for general adult medical examination without abnormal findings: Secondary | ICD-10-CM | POA: Diagnosis not present

## 2015-08-03 DIAGNOSIS — D696 Thrombocytopenia, unspecified: Secondary | ICD-10-CM | POA: Diagnosis not present

## 2015-08-03 DIAGNOSIS — Z833 Family history of diabetes mellitus: Secondary | ICD-10-CM | POA: Diagnosis not present

## 2015-08-03 DIAGNOSIS — Z114 Encounter for screening for human immunodeficiency virus [HIV]: Secondary | ICD-10-CM

## 2015-08-03 DIAGNOSIS — Z1159 Encounter for screening for other viral diseases: Secondary | ICD-10-CM

## 2015-08-03 NOTE — Patient Instructions (Addendum)
Borderline low platelet  prob  Not clinically significant  Continue  Attention to lifestyle intervention healthy eating and exercise .  Recheck cbc diff platelets    And do hiv aby  and hep C screen  If all ok then yearly check .   Health Maintenance, Male A healthy lifestyle and preventative care can promote health and wellness.  Maintain regular health, dental, and eye exams.  Eat a healthy diet. Foods like vegetables, fruits, whole grains, low-fat dairy products, and lean protein foods contain the nutrients you need and are low in calories. Decrease your intake of foods high in solid fats, added sugars, and salt. Get information about a proper diet from your health care provider, if necessary.  Regular physical exercise is one of the most important things you can do for your health. Most adults should get at least 150 minutes of moderate-intensity exercise (any activity that increases your heart rate and causes you to sweat) each week. In addition, most adults need muscle-strengthening exercises on 2 or more days a week.   Maintain a healthy weight. The body mass index (BMI) is a screening tool to identify possible weight problems. It provides an estimate of body fat based on height and weight. Your health care provider can find your BMI and can help you achieve or maintain a healthy weight. For males 20 years and older:  A BMI below 18.5 is considered underweight.  A BMI of 18.5 to 24.9 is normal.  A BMI of 25 to 29.9 is considered overweight.  A BMI of 30 and above is considered obese.  Maintain normal blood lipids and cholesterol by exercising and minimizing your intake of saturated fat. Eat a balanced diet with plenty of fruits and vegetables. Blood tests for lipids and cholesterol should begin at age 50 and be repeated every 5 years. If your lipid or cholesterol levels are high, you are over age 44, or you are at high risk for heart disease, you may need your cholesterol levels  checked more frequently.Ongoing high lipid and cholesterol levels should be treated with medicines if diet and exercise are not working.  If you smoke, find out from your health care provider how to quit. If you do not use tobacco, do not start.  Lung cancer screening is recommended for adults aged 55-80 years who are at high risk for developing lung cancer because of a history of smoking. A yearly low-dose CT scan of the lungs is recommended for people who have at least a 30-pack-year history of smoking and are current smokers or have quit within the past 15 years. A pack year of smoking is smoking an average of 1 pack of cigarettes a day for 1 year (for example, a 30-pack-year history of smoking could mean smoking 1 pack a day for 30 years or 2 packs a day for 15 years). Yearly screening should continue until the smoker has stopped smoking for at least 15 years. Yearly screening should be stopped for people who develop a health problem that would prevent them from having lung cancer treatment.  If you choose to drink alcohol, do not have more than 2 drinks per day. One drink is considered to be 12 oz (360 mL) of beer, 5 oz (150 mL) of wine, or 1.5 oz (45 mL) of liquor.  Avoid the use of street drugs. Do not share needles with anyone. Ask for help if you need support or instructions about stopping the use of drugs.  High blood pressure  causes heart disease and increases the risk of stroke. High blood pressure is more likely to develop in:  People who have blood pressure in the end of the normal range (100-139/85-89 mm Hg).  People who are overweight or obese.  People who are African American.  If you are 66-69 years of age, have your blood pressure checked every 3-5 years. If you are 2 years of age or older, have your blood pressure checked every year. You should have your blood pressure measured twice--once when you are at a hospital or clinic, and once when you are not at a hospital or clinic.  Record the average of the two measurements. To check your blood pressure when you are not at a hospital or clinic, you can use:  An automated blood pressure machine at a pharmacy.  A home blood pressure monitor.  If you are 64-67 years old, ask your health care provider if you should take aspirin to prevent heart disease.  Diabetes screening involves taking a blood sample to check your fasting blood sugar level. This should be done once every 3 years after age 38 if you are at a normal weight and without risk factors for diabetes. Testing should be considered at a younger age or be carried out more frequently if you are overweight and have at least 1 risk factor for diabetes.  Colorectal cancer can be detected and often prevented. Most routine colorectal cancer screening begins at the age of 44 and continues through age 67. However, your health care provider may recommend screening at an earlier age if you have risk factors for colon cancer. On a yearly basis, your health care provider may provide home test kits to check for hidden blood in the stool. A small camera at the end of a tube may be used to directly examine the colon (sigmoidoscopy or colonoscopy) to detect the earliest forms of colorectal cancer. Talk to your health care provider about this at age 46 when routine screening begins. A direct exam of the colon should be repeated every 5-10 years through age 59, unless early forms of precancerous polyps or small growths are found.  People who are at an increased risk for hepatitis B should be screened for this virus. You are considered at high risk for hepatitis B if:  You were born in a country where hepatitis B occurs often. Talk with your health care provider about which countries are considered high risk.  Your parents were born in a high-risk country and you have not received a shot to protect against hepatitis B (hepatitis B vaccine).  You have HIV or AIDS.  You use needles to  inject street drugs.  You live with, or have sex with, someone who has hepatitis B.  You are a man who has sex with other men (MSM).  You get hemodialysis treatment.  You take certain medicines for conditions like cancer, organ transplantation, and autoimmune conditions.  Hepatitis C blood testing is recommended for all people born from [birth year] through 1963 and any individual with known risk factors for hepatitis C.  Healthy men should no longer receive prostate-specific antigen (PSA) blood tests as part of routine cancer screening. Talk to your health care provider about prostate cancer screening.  Testicular cancer screening is not recommended for adolescents or adult males who have no symptoms. Screening includes self-exam, a health care provider exam, and other screening tests. Consult with your health care provider about any symptoms you have or any concerns you have  about testicular cancer.  Practice safe sex. Use condoms and avoid high-risk sexual practices to reduce the spread of sexually transmitted infections (STIs).  You should be screened for STIs, including gonorrhea and chlamydia if:  You are sexually active and are younger than 24 years.  You are older than 24 years, and your health care provider tells you that you are at risk for this type of infection.  Your sexual activity has changed since you were last screened, and you are at an increased risk for chlamydia or gonorrhea. Ask your health care provider if you are at risk.  If you are at risk of being infected with HIV, it is recommended that you take a prescription medicine daily to prevent HIV infection. This is called pre-exposure prophylaxis (PrEP). You are considered at risk if:  You are a man who has sex with other men (MSM).  You are a heterosexual man who is sexually active with multiple partners.  You take drugs by injection.  You are sexually active with a partner who has HIV.  Talk with your health care  provider about whether you are at high risk of being infected with HIV. If you choose to begin PrEP, you should first be tested for HIV. You should then be tested every 3 months for as long as you are taking PrEP.  Use sunscreen. Apply sunscreen liberally and repeatedly throughout the day. You should seek shade when your shadow is shorter than you. Protect yourself by wearing long sleeves, pants, a wide-brimmed hat, and sunglasses year round whenever you are outdoors.  Tell your health care provider of new moles or changes in moles, especially if there is a change in shape or color. Also, tell your health care provider if a mole is larger than the size of a pencil eraser.  A one-time screening for abdominal aortic aneurysm (AAA) and surgical repair of large AAAs by ultrasound is recommended for men aged 67-75 years who are current or former smokers.  Stay current with your vaccines (immunizations).   This information is not intended to replace advice given to you by your health care provider. Make sure you discuss any questions you have with your health care provider.   Document Released: 06/17/2007 Document Revised: 01/09/2014 Document Reviewed: 05/16/2010 Elsevier Interactive Patient Education [employer].

## 2015-08-30 DIAGNOSIS — Z0000 Encounter for general adult medical examination without abnormal findings: Secondary | ICD-10-CM | POA: Diagnosis not present

## 2015-09-01 DIAGNOSIS — E119 Type 2 diabetes mellitus without complications: Secondary | ICD-10-CM | POA: Diagnosis not present

## 2015-09-03 ENCOUNTER — Other Ambulatory Visit (INDEPENDENT_AMBULATORY_CARE_PROVIDER_SITE_OTHER): Payer: BLUE CROSS/BLUE SHIELD

## 2015-09-03 DIAGNOSIS — D696 Thrombocytopenia, unspecified: Secondary | ICD-10-CM | POA: Diagnosis not present

## 2015-09-03 DIAGNOSIS — Z114 Encounter for screening for human immunodeficiency virus [HIV]: Secondary | ICD-10-CM | POA: Diagnosis not present

## 2015-09-03 DIAGNOSIS — Z1159 Encounter for screening for other viral diseases: Secondary | ICD-10-CM

## 2015-09-03 LAB — CBC WITH DIFFERENTIAL/PLATELET
Basophils Absolute: 0 10*3/uL (ref 0.0–0.1)
Basophils Relative: 0.5 % (ref 0.0–3.0)
EOS PCT: 2.9 % (ref 0.0–5.0)
Eosinophils Absolute: 0.1 10*3/uL (ref 0.0–0.7)
HEMATOCRIT: 47.7 % (ref 39.0–52.0)
HEMOGLOBIN: 15.5 g/dL (ref 13.0–17.0)
Lymphocytes Relative: 32.7 % (ref 12.0–46.0)
Lymphs Abs: 1.5 10*3/uL (ref 0.7–4.0)
MCHC: 32.5 g/dL (ref 30.0–36.0)
MCV: 77.9 fl — AB (ref 78.0–100.0)
MONOS PCT: 9.4 % (ref 3.0–12.0)
Monocytes Absolute: 0.4 10*3/uL (ref 0.1–1.0)
Neutro Abs: 2.6 10*3/uL (ref 1.4–7.7)
Neutrophils Relative %: 54.5 % (ref 43.0–77.0)
Platelets: 153 10*3/uL (ref 150.0–400.0)
RBC: 6.12 Mil/uL — AB (ref 4.22–5.81)
RDW: 15.1 % (ref 11.5–15.5)
WBC: 4.7 10*3/uL (ref 4.0–10.5)

## 2015-09-03 LAB — HIV ANTIBODY (ROUTINE TESTING W REFLEX): HIV: NONREACTIVE

## 2015-09-03 LAB — HEPATITIS C ANTIBODY: HCV Ab: NEGATIVE

## 2015-12-08 DIAGNOSIS — L80 Vitiligo: Secondary | ICD-10-CM | POA: Diagnosis not present

## 2016-07-28 ENCOUNTER — Other Ambulatory Visit: Payer: BLUE CROSS/BLUE SHIELD

## 2016-08-03 NOTE — Progress Notes (Signed)
Chief Complaint  Patient presents with  . Annual Exam    HPI: Patient  Jake Delgado  61 y.o. comes in today for Preventive Health Care visit  Sees derm for vitiligo and dr balan regarding to hyperglycemia   lsi no meds with this   bp 130/90   At home  Despite exercise healthy diet . No cv pulm sx at this time Vitiligo some progression.  Health Maintenance  Topic Date Due  . INFLUENZA VACCINE  08/02/2016  . COLONOSCOPY  02/27/2019  . TETANUS/TDAP  01/14/2024  . Hepatitis C Screening  Completed  . HIV Screening  Completed   Health Maintenance Review LIFESTYLE:  Exercise:   Work out  Occupational psychologist .   Tobacco/ETS: no Alcohol:   No rare  Sugar beverages: rare.  Sleep: 5 hours    A nap .  Drug use: no  HH of 2  No pets  Married  Work:  About  Total 60   ROS:  GEN/ HEENT: No fever, significant weight changes sweats headaches vision problems hearing changes, CV/ PULM; No chest pain shortness of breath cough, syncope,edema  change in exercise tolerance. GI /GU: No adominal pain, vomiting, change in bowel habits. No blood in the stool. No significant GU symptoms. SKIN/HEME: ,no new  acute skin rashes suspicious lesions or bleeding. No lymphadenopathy, nodules, masses.  NEURO/ PSYCH:  No neurologic signs such as weakness numbness. No depression anxiety. IMM/ Allergy: No unusual infections.  Allergy .   REST of 12 system review negative except as per HPI   Past Medical History:  Diagnosis Date  . H/O left knee surgery    MCL 2008 Dr. Maureen Ralphs  . History of exercise stress test 2015   exercise cardiopulmonary  test  . History of varicella   . Vitiligo    when younger no treatment doesn't run in families.    Past Surgical History:  Procedure Laterality Date  . CARDIOVASCULAR STRESS TEST    . KNEE ARTHROSCOPY      Family History  Problem Relation Age of Onset  . Hypertension Mother   . Diabetes Mellitus II Mother   . Heart disease Unknown        maternal uncle in his 34s      Social History   Social History  . Marital status: Married    Spouse name: N/A  . Number of children: N/A  . Years of education: N/A   Occupational History  . Scientist, clinical (histocompatibility and immunogenetics)    Social History Main Topics  . Smoking status: Never Smoker  . Smokeless tobacco: Never Used  . Alcohol use No  . Drug use: No  . Sexual activity: Not Asked   Other Topics Concern  . 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   Give ETS FA   Vegetarian but  Pos dairly and sometimes meat    Outpatient Medications Prior to Visit  Medication Sig Dispense Refill  . cetirizine (ZYRTEC) 10 MG tablet Take 10 mg by mouth daily.     No facility-administered medications prior to visit.      EXAM:  BP 130/90 (BP Location: Right Arm, Patient Position: Sitting, Cuff Size:  Normal)   Pulse (!) 52   Temp 98.3 F (36.8 C) (Oral)   Ht 5' 10.5" (1.791 m)   Wt 203 lb 14.4 oz (92.5 kg)   BMI 28.84 kg/m   Body mass index is 28.84 kg/m. Wt Readings from Last 3 Encounters:  08/04/16 203 lb 14.4 oz (92.5 kg)  08/03/15 208 lb 12.8 oz (94.7 kg)  07/28/14 202 lb 14.4 oz (92 kg)    Physical Exam: Vital signs reviewed YHC:WCBJ is a well-developed well-nourished alert cooperative    who appearsr stated age in no acute distress.  HEENT: normocephalic atraumatic , Eyes: PERRL EOM's full, conjunctiva clear, Nares: paten,t no deformity discharge or tenderness., Ears: no deformity EAC's clear TMs with normal landmarks. Mouth: clear OP, no lesions, edema.  Moist mucous membranes. Dentition in adequate repair. NECK: supple without masses, thyromegaly or bruits. CHEST/PULM:  Clear to auscultation and percussion breath sounds equal no wheeze , rales or rhonchi. No  chest wall deformities or tenderness. Breast: normal by inspection . No dimpling, discharge, masses, tenderness or discharge . CV: PMI is nondisplaced, S1 S2 no gallops, murmurs, rubs. Peripheral pulses are full without delay.No JVD .  ABDOMEN: Bowel sounds normal nontender  No guard or rebound, no hepato splenomegal no CVA tenderness.  No hernia. Extremtities:  No clubbing cyanosis or edema, no acute joint swelling or redness no focal atrophy NEURO:  Oriented x3, cranial nerves 3-12 appear to be intact, no obvious focal weakness,gait within normal limits no abnormal reflexes or asymmetrical SKIN: No acute rashes normal turgor, color, no bruising or petechiae. Vitiligo changes   Left second toe dark  Hematoma non tender   Rectal prostate 1 + no nodules  No masses  PSYCH: Oriented, good eye contact, no obvious depression anxiety, cognition and judgment appear normal. LN: no cervical axillary inguinal adenopathy EKG NSR brady  Nl intervals  Lab Results  Component Value Date   WBC 4.7 09/03/2015   HGB 15.5 09/03/2015   HCT 47.7 09/03/2015   PLT 153.0 09/03/2015   GLUCOSE 95 07/27/2015   CHOL 151 07/27/2015   TRIG 83.0 07/27/2015   HDL 42.50 07/27/2015   LDLCALC 92 07/27/2015   ALT 25 07/27/2015   AST 20 07/27/2015   NA 141 07/27/2015   K 4.2 07/27/2015   CL 102 07/27/2015   CREATININE 1.00 07/27/2015   BUN 20 07/27/2015   CO2 29 07/27/2015   TSH 1.19 07/27/2015   PSA 1.20 07/27/2015   HGBA1C 5.7 07/21/2014    BP Readings from Last 3 Encounters:  08/04/16 130/90  08/03/15 134/80  07/28/14 122/80    Lab results reviewed with patient   ASSESSMENT AND PLAN:  Discussed the following assessment and plan:  Visit for preventive health examination - Plan: Basic metabolic panel, CBC with Differential/Platelet, Hepatic function panel, Lipid panel, Hemoglobin A1c, EKG 12-Lead  Fasting hyperglycemia hx  - Plan: Basic metabolic panel, CBC with Differential/Platelet, Hepatic function  panel, Lipid panel, Hemoglobin A1c  Essential hypertension - Plan: Basic metabolic panel, CBC with Differential/Platelet, Hepatic function panel, Lipid panel, Hemoglobin A1c, EKG 62-GBTD, Basic metabolic panel  Family history of diabetes mellitus - Plan: Basic metabolic panel, CBC with Differential/Platelet, Hepatic function panel, Lipid panel, Hemoglobin A1c  Screening PSA (prostate specific antigen) - Plan: PSA  Vitiligo Shared Decision Making reviewed   bp guidelines  And add low dose acei   Expectant management. And fu readings   For  Control my chart   If ok then bmp in  1-2 mos  And yealry check  To monitor toe seems ecchymosis   Fu  If atypical  With his derm  Patient Care Team: Genella Bas, Standley Brooking, MD as PCP - General (Internal Medicine) Tanda Rockers, MD as Consulting Physician (Pulmonary Disease) Jacelyn Pi, MD as Consulting Physician (Endocrinology) Martinique, Amy, MD as Consulting Physician (Dermatology) Patient Instructions  New BP goal  120/80   So begin   Low dose medication  Check readings  And send in readings.  Continue healthy diet and exercise   "dash eating  "  After  2 months   . If ok then would repeat  A chemistry  check at that time.  And ROV  If all ok then yearly check.    Check into shingles vaccine. Shingrix  Can receive either in the office or pharmacy depending on insurance rules.     Preventive Care 40-64 Years, Male Preventive care refers to lifestyle choices and visits with your health care provider that can promote health and wellness. What does preventive care include?  A yearly physical exam. This is also called an annual well check.  Dental exams once or twice a year.  Routine eye exams. Ask your health care provider how often you should have your eyes checked.  Personal lifestyle choices, including: ? Daily care of your teeth and gums. ? Regular physical activity. ? Eating a healthy diet. ? Avoiding tobacco and drug use. ? Limiting  alcohol use. ? Practicing safe sex. ? Taking low-dose aspirin every day starting at age 51. What happens during an annual well check? The services and screenings done by your health care provider during your annual well check will depend on your age, overall health, lifestyle risk factors, and family history of disease. Counseling Your health care provider may ask you questions about your:  Alcohol use.  Tobacco use.  Drug use.  Emotional well-being.  Home and relationship well-being.  Sexual activity.  Eating habits.  Work and work Statistician.  Screening You may have the following tests or measurements:  Height, weight, and BMI.  Blood pressure.  Lipid and cholesterol levels. These may be checked every 5 years, or more frequently if you are over 35 years old.  Skin check.  Lung cancer screening. You may have this screening every year starting at age 12 if you have a 30-pack-year history of smoking and currently smoke or have quit within the past 15 years.  Fecal occult blood test (FOBT) of the stool. You may have this test every year starting at age 9.  Flexible sigmoidoscopy or colonoscopy. You may have a sigmoidoscopy every 5 years or a colonoscopy every 10 years starting at age 52.  Prostate cancer screening. Recommendations will vary depending on your family history and other risks.  Hepatitis C blood test.  Hepatitis B blood test.  Sexually transmitted disease (STD) testing.  Diabetes screening. This is done by checking your blood sugar (glucose) after you have not eaten for a while (fasting). You may have this done every 1-3 years.  Discuss your test results, treatment options, and if necessary, the need for more tests with your health care provider. Vaccines Your health care provider may recommend certain vaccines, such as:  Influenza vaccine. This is recommended every year.  Tetanus, diphtheria, and acellular pertussis (Tdap, Td) vaccine. You may  need a Td booster every 10 years.  Varicella vaccine. You may need this if you have not been vaccinated.  Zoster vaccine. You may need this  after age 52.  Measles, mumps, and rubella (MMR) vaccine. You may need at least one dose of MMR if you were born in 1957 or later. You may also need a second dose.  Pneumococcal 13-valent conjugate (PCV13) vaccine. You may need this if you have certain conditions and have not been vaccinated.  Pneumococcal polysaccharide (PPSV23) vaccine. You may need one or two doses if you smoke cigarettes or if you have certain conditions.  Meningococcal vaccine. You may need this if you have certain conditions.  Hepatitis A vaccine. You may need this if you have certain conditions or if you travel or work in places where you may be exposed to hepatitis A.  Hepatitis B vaccine. You may need this if you have certain conditions or if you travel or work in places where you may be exposed to hepatitis B.  Haemophilus influenzae type b (Hib) vaccine. You may need this if you have certain risk factors.  Talk to your health care provider about which screenings and vaccines you need and how often you need them. This information is not intended to replace advice given to you by your health care provider. Make sure you discuss any questions you have with your health care provider. Document Released: 01/15/2015 Document Revised: 09/08/2015 Document Reviewed: 10/20/2014 Elsevier Interactive Patient Education  2017 Odin K. Jamarious Febo M.D.

## 2016-08-04 ENCOUNTER — Ambulatory Visit (INDEPENDENT_AMBULATORY_CARE_PROVIDER_SITE_OTHER): Payer: BLUE CROSS/BLUE SHIELD | Admitting: Internal Medicine

## 2016-08-04 ENCOUNTER — Encounter: Payer: Self-pay | Admitting: Internal Medicine

## 2016-08-04 VITALS — BP 130/90 | HR 52 | Temp 98.3°F | Ht 70.5 in | Wt 203.9 lb

## 2016-08-04 DIAGNOSIS — I10 Essential (primary) hypertension: Secondary | ICD-10-CM | POA: Diagnosis not present

## 2016-08-04 DIAGNOSIS — Z125 Encounter for screening for malignant neoplasm of prostate: Secondary | ICD-10-CM | POA: Diagnosis not present

## 2016-08-04 DIAGNOSIS — L80 Vitiligo: Secondary | ICD-10-CM

## 2016-08-04 DIAGNOSIS — R7301 Impaired fasting glucose: Secondary | ICD-10-CM

## 2016-08-04 DIAGNOSIS — Z833 Family history of diabetes mellitus: Secondary | ICD-10-CM | POA: Diagnosis not present

## 2016-08-04 DIAGNOSIS — Z0000 Encounter for general adult medical examination without abnormal findings: Secondary | ICD-10-CM

## 2016-08-04 LAB — HEPATIC FUNCTION PANEL
ALBUMIN: 4 g/dL (ref 3.5–5.2)
ALK PHOS: 78 U/L (ref 39–117)
ALT: 20 U/L (ref 0–53)
AST: 22 U/L (ref 0–37)
BILIRUBIN TOTAL: 0.8 mg/dL (ref 0.2–1.2)
Bilirubin, Direct: 0.1 mg/dL (ref 0.0–0.3)
Total Protein: 6.7 g/dL (ref 6.0–8.3)

## 2016-08-04 LAB — CBC WITH DIFFERENTIAL/PLATELET
BASOS ABS: 0 10*3/uL (ref 0.0–0.1)
Basophils Relative: 0.6 % (ref 0.0–3.0)
Eosinophils Absolute: 0.1 10*3/uL (ref 0.0–0.7)
Eosinophils Relative: 3.2 % (ref 0.0–5.0)
HCT: 49.8 % (ref 39.0–52.0)
Hemoglobin: 15.8 g/dL (ref 13.0–17.0)
LYMPHS ABS: 1.3 10*3/uL (ref 0.7–4.0)
Lymphocytes Relative: 29.4 % (ref 12.0–46.0)
MCHC: 31.7 g/dL (ref 30.0–36.0)
MCV: 78.8 fl (ref 78.0–100.0)
MONO ABS: 0.5 10*3/uL (ref 0.1–1.0)
MONOS PCT: 9.8 % (ref 3.0–12.0)
NEUTROS ABS: 2.6 10*3/uL (ref 1.4–7.7)
Neutrophils Relative %: 57 % (ref 43.0–77.0)
Platelets: 130 10*3/uL — ABNORMAL LOW (ref 150.0–400.0)
RBC: 6.32 Mil/uL — ABNORMAL HIGH (ref 4.22–5.81)
RDW: 15.3 % (ref 11.5–15.5)
WBC: 4.6 10*3/uL (ref 4.0–10.5)

## 2016-08-04 LAB — BASIC METABOLIC PANEL
BUN: 21 mg/dL (ref 6–23)
CALCIUM: 8.9 mg/dL (ref 8.4–10.5)
CHLORIDE: 104 meq/L (ref 96–112)
CO2: 30 mEq/L (ref 19–32)
CREATININE: 0.95 mg/dL (ref 0.40–1.50)
GFR: 86.34 mL/min (ref >60.00)
Glucose, Bld: 95 mg/dL (ref 70–99)
POTASSIUM: 5 meq/L (ref 3.5–5.1)
Sodium: 138 mEq/L (ref 135–145)

## 2016-08-04 LAB — LIPID PANEL
CHOLESTEROL: 162 mg/dL (ref 0–200)
HDL: 47.7 mg/dL (ref >39.00)
LDL Cholesterol: 93 mg/dL (ref 0–99)
NonHDL: 114.41
Total CHOL/HDL Ratio: 3
Triglycerides: 108 mg/dL (ref 0.0–149.0)
VLDL: 21.6 mg/dL (ref 0.0–40.0)

## 2016-08-04 LAB — HEMOGLOBIN A1C: Hgb A1c MFr Bld: 6.1 % (ref 4.6–6.5)

## 2016-08-04 LAB — PSA: PSA: 1.24 ng/mL (ref 0.10–4.00)

## 2016-08-04 MED ORDER — LISINOPRIL 10 MG PO TABS: 10.0000 mg | ORAL_TABLET | Freq: Every day | ORAL | 1 refills | Status: DC

## 2016-08-04 NOTE — Patient Instructions (Addendum)
New BP goal  120/80   So begin   Low dose medication  Check readings  And send in readings.  Continue healthy diet and exercise   "dash eating  "  After  2 months   . If ok then would repeat  A chemistry  check at that time.  And ROV  If all ok then yearly check.    Check into shingles vaccine. Shingrix  Can receive either in the office or pharmacy depending on insurance rules.     Preventive Care 40-64 Years, Male Preventive care refers to lifestyle choices and visits with your health care provider that can promote health and wellness. What does preventive care include?  A yearly physical exam. This is also called an annual well check.  Dental exams once or twice a year.  Routine eye exams. Ask your health care provider how often you should have your eyes checked.  Personal lifestyle choices, including: ? Daily care of your teeth and gums. ? Regular physical activity. ? Eating a healthy diet. ? Avoiding tobacco and drug use. ? Limiting alcohol use. ? Practicing safe sex. ? Taking low-dose aspirin every day starting at age 71. What happens during an annual well check? The services and screenings done by your health care provider during your annual well check will depend on your age, overall health, lifestyle risk factors, and family history of disease. Counseling Your health care provider may ask you questions about your:  Alcohol use.  Tobacco use.  Drug use.  Emotional well-being.  Home and relationship well-being.  Sexual activity.  Eating habits.  Work and work Statistician.  Screening You may have the following tests or measurements:  Height, weight, and BMI.  Blood pressure.  Lipid and cholesterol levels. These may be checked every 5 years, or more frequently if you are over 60 years old.  Skin check.  Lung cancer screening. You may have this screening every year starting at age 18 if you have a 30-pack-year history of smoking and currently smoke or  have quit within the past 15 years.  Fecal occult blood test (FOBT) of the stool. You may have this test every year starting at age 35.  Flexible sigmoidoscopy or colonoscopy. You may have a sigmoidoscopy every 5 years or a colonoscopy every 10 years starting at age 31.  Prostate cancer screening. Recommendations will vary depending on your family history and other risks.  Hepatitis C blood test.  Hepatitis B blood test.  Sexually transmitted disease (STD) testing.  Diabetes screening. This is done by checking your blood sugar (glucose) after you have not eaten for a while (fasting). You may have this done every 1-3 years.  Discuss your test results, treatment options, and if necessary, the need for more tests with your health care provider. Vaccines Your health care provider may recommend certain vaccines, such as:  Influenza vaccine. This is recommended every year.  Tetanus, diphtheria, and acellular pertussis (Tdap, Td) vaccine. You may need a Td booster every 10 years.  Varicella vaccine. You may need this if you have not been vaccinated.  Zoster vaccine. You may need this after age 40.  Measles, mumps, and rubella (MMR) vaccine. You may need at least one dose of MMR if you were born in 1957 or later. You may also need a second dose.  Pneumococcal 13-valent conjugate (PCV13) vaccine. You may need this if you have certain conditions and have not been vaccinated.  Pneumococcal polysaccharide (PPSV23) vaccine. You may need one  or two doses if you smoke cigarettes or if you have certain conditions.  Meningococcal vaccine. You may need this if you have certain conditions.  Hepatitis A vaccine. You may need this if you have certain conditions or if you travel or work in places where you may be exposed to hepatitis A.  Hepatitis B vaccine. You may need this if you have certain conditions or if you travel or work in places where you may be exposed to hepatitis B.  Haemophilus  influenzae type b (Hib) vaccine. You may need this if you have certain risk factors.  Talk to your health care provider about which screenings and vaccines you need and how often you need them. This information is not intended to replace advice given to you by your health care provider. Make sure you discuss any questions you have with your health care provider. Document Released: 01/15/2015 Document Revised: 09/08/2015 Document Reviewed: 10/20/2014 Elsevier Interactive Patient Education  2017 [employer].

## 2016-08-04 NOTE — Assessment & Plan Note (Signed)
Still 130/90 despite  Goo lsi dis cnew goals add low dose  acei and fu see text

## 2016-08-14 ENCOUNTER — Ambulatory Visit (INDEPENDENT_AMBULATORY_CARE_PROVIDER_SITE_OTHER): Payer: BLUE CROSS/BLUE SHIELD

## 2016-08-14 DIAGNOSIS — Z23 Encounter for immunization: Secondary | ICD-10-CM | POA: Diagnosis not present

## 2016-08-31 DIAGNOSIS — E119 Type 2 diabetes mellitus without complications: Secondary | ICD-10-CM | POA: Diagnosis not present

## 2016-09-22 ENCOUNTER — Encounter: Payer: Self-pay | Admitting: Internal Medicine

## 2016-10-05 ENCOUNTER — Ambulatory Visit: Payer: BLUE CROSS/BLUE SHIELD | Admitting: Internal Medicine

## 2016-10-10 ENCOUNTER — Ambulatory Visit: Payer: BLUE CROSS/BLUE SHIELD

## 2016-10-10 ENCOUNTER — Ambulatory Visit (INDEPENDENT_AMBULATORY_CARE_PROVIDER_SITE_OTHER): Payer: BLUE CROSS/BLUE SHIELD | Admitting: *Deleted

## 2016-10-10 DIAGNOSIS — Z23 Encounter for immunization: Secondary | ICD-10-CM | POA: Diagnosis not present

## 2016-10-13 DIAGNOSIS — H2513 Age-related nuclear cataract, bilateral: Secondary | ICD-10-CM | POA: Diagnosis not present

## 2016-10-17 ENCOUNTER — Ambulatory Visit: Payer: BLUE CROSS/BLUE SHIELD | Admitting: Internal Medicine

## 2016-10-17 ENCOUNTER — Ambulatory Visit: Payer: BLUE CROSS/BLUE SHIELD

## 2017-01-31 ENCOUNTER — Other Ambulatory Visit: Payer: Self-pay | Admitting: Internal Medicine

## 2017-01-31 DIAGNOSIS — L738 Other specified follicular disorders: Secondary | ICD-10-CM | POA: Diagnosis not present

## 2017-01-31 DIAGNOSIS — L80 Vitiligo: Secondary | ICD-10-CM | POA: Diagnosis not present

## 2017-01-31 DIAGNOSIS — L918 Other hypertrophic disorders of the skin: Secondary | ICD-10-CM | POA: Diagnosis not present

## 2017-01-31 DIAGNOSIS — L821 Other seborrheic keratosis: Secondary | ICD-10-CM | POA: Diagnosis not present

## 2017-03-02 DIAGNOSIS — M13862 Other specified arthritis, left knee: Secondary | ICD-10-CM | POA: Diagnosis not present

## 2017-08-05 ENCOUNTER — Other Ambulatory Visit: Payer: Self-pay | Admitting: Internal Medicine

## 2017-08-06 NOTE — Progress Notes (Addendum)
Chief Complaint  Patient presents with  . Annual Exam    Labs done // Cough since starting Lisinopril - tickle in throat and hacky cough    HPI: Patient  Jake Delgado  62 y.o. comes in today for Preventive Health Care visit  Bp  Controlled   At home  120 range  But annoying cough wo cp sob  blood sugar  Sees Balan yearly normal controlled by diet and exercise .  No numbness  Gets yearly eye check  Skin sees [name]  Going to trip to [place]   Needs form completed.  Health Maintenance  Topic Date Due  . INFLUENZA VACCINE  08/02/2017  . COLONOSCOPY  02/27/2019  . TETANUS/TDAP  01/14/2024  . Hepatitis C Screening  Completed  . HIV Screening  Completed   Health Maintenance Review LIFESTYLE:  Exercise:   Weekly exercises  Left knee  Saw dr Despina Hick.  Tobacco/ETS: no Alcohol: no Sugar beverages: no Sleep: 6  Drug use: no HH of  2  No pets  Work: 50 +      ROS:  Left ear  Check clogged. GEN/ HEENT: No fever, significant weight changes sweats headaches vision problems hearing changes, CV/ PULM; No chest pain shortness of breath cough, syncope,edema  change in exercise tolerance. GI /GU: No adominal pain, vomiting, change in bowel habits. No blood in the stool. No significant GU symptoms. SKIN/HEME: ,no acute skin rashes suspicious lesions or bleeding. No lymphadenopathy, nodules, masses.  NEURO/ PSYCH:  No neurologic signs such as weakness numbness. No depression anxiety. IMM/ Allergy: No unusual infections.  Allergy .   REST of 12 system review negative except as per HPI   Past Medical History:  Diagnosis Date  . H/O left knee surgery    MCL 2008 Dr. Despina Hick  . History of exercise stress test 2015   exercise cardiopulmonary  test  . History of varicella   . Vitiligo    when younger no treatment doesn't run in families.    Past Surgical History:  Procedure Laterality Date  . CARDIOVASCULAR STRESS TEST    . KNEE ARTHROSCOPY      Family History  Problem  Relation Age of Onset  . Hypertension Mother   . Diabetes Mellitus II Mother   . Heart disease Unknown        maternal uncle in his 50s    Social History   Socioeconomic History  . Marital status: Married    Spouse name: Not on file  . Number of children: Not on file  . Years of education: Not on file  . Highest education level: Not on file  Occupational History  . Occupation: Surveyor, minerals  Social Needs  . Financial resource strain: Not on file  . Food insecurity:    Worry: Not on file    Inability: Not on file  . Transportation needs:    Medical: Not on file    Non-medical: Not on file  Tobacco Use  . Smoking status: Never Smoker  . Smokeless tobacco: Never Used  Substance and Sexual Activity  . Alcohol use: No  . Drug use: No  . Sexual activity: Not on file  Lifestyle  . Physical activity:    Days per week: Not on file    Minutes per session: Not on file  . Stress: Not on file  Relationships  . Social connections:    Talks on phone: Not on file    Gets together: Not on file  Attends religious service: Not on file    Active member of club or organization: Not on file    Attends meetings of clubs or organizations: Not on file    Relationship status: Not on file  Other Topics Concern  . 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   Give ETS FA   Vegetarian but  Pos dairly and sometimes meat    Outpatient Medications Prior to Visit  Medication Sig Dispense Refill  . cetirizine (ZYRTEC) 10 MG tablet Take 10 mg by mouth daily.    Marland Kitchen lisinopril (PRINIVIL,ZESTRIL) 10 MG tablet TAKE 1 TABLET BY MOUTH EVERY DAY 90 tablet 0   No facility-administered medications prior to visit.      EXAM:  BP 128/80  (BP Location: Right Arm, Patient Position: Sitting, Cuff Size: Normal)   Pulse (!) 57   Temp 98.6 F (37 C) (Oral)   Ht 5' 9.5" (1.765 m)   Wt 207 lb 9.6 oz (94.2 kg)   SpO2 95%   BMI 30.22 kg/m   Body mass index is 30.22 kg/m. Wt Readings from Last 3 Encounters:  08/07/17 207 lb 9.6 oz (94.2 kg)  08/04/16 203 lb 14.4 oz (92.5 kg)  08/03/15 208 lb 12.8 oz (94.7 kg)    Physical Exam: Vital signs reviewed YQM:VHQI is a well-developed well-nourished alert cooperative    who appearsr stated age in no acute distress.  HEENT: normocephalic atraumatic , Eyes: PERRL EOM's full, conjunctiva clear, Nares: paten,t no deformity discharge or tenderness., Ears: no deformity EAC's left eac wax flushed TMs with normal landmarks. Mouth: clear OP, no lesions, edema.  Moist mucous membranes. Dentition in adequate repair. NECK: supple without masses, thyromegaly or bruits. CHEST/PULM:  Clear to auscultation and percussion breath sounds equal no wheeze , rales or rhonchi. No chest wall deformities or tenderness.. CV: PMI is nondisplaced, S1 S2 no gallops, murmurs, rubs. Peripheral pulses are full without delay.No JVD .  ABDOMEN: Bowel sounds normal nontender  No guard or rebound, no hepato splenomegal no CVA tenderness.  No hernia. Extremtities:  No clubbing cyanosis or edema, no acute joint swelling or redness no focal atrophy NEURO:  Oriented x3, cranial nerves 3-12 appear to be intact, no obvious focal weakness,gait within normal limits no abnormal reflexes or asymmetrical SKIN: No acute rashes normal turgor, , no bruising or petechiae.  Vitiligo findings  PSYCH: Oriented, good eye contact, no obvious depression anxiety, cognition and judgment appear normal. LN: no cervical axillary inguinal adenopathy Rectal no lesion prostate nl considtantcy no nodules or masses  Diabetic Foot Exam - Simple   Simple Foot Form Diabetic Foot exam was performed with the following findings:  Yes 08/07/2017  8:55 AM    Visual Inspection No deformities, no ulcerations, no other skin breakdown bilaterally:  Yes Sensation Testing Intact to touch and monofilament testing bilaterally:  Yes Pulse Check Posterior Tibialis and Dorsalis pulse intact bilaterally:  Yes Comments     Lab Results  Component Value Date   WBC 6.0 08/07/2017   HGB 15.8 08/07/2017   HCT 50.3 08/07/2017   PLT 103.0 (L) 08/07/2017   GLUCOSE 103 (H) 08/07/2017   CHOL 144 08/07/2017  TRIG 97.0 08/07/2017   HDL 40.10 08/07/2017   LDLCALC 84 08/07/2017   ALT 18 08/07/2017   AST 15 08/07/2017   NA 138 08/07/2017   K 4.6 08/07/2017   CL 101 08/07/2017   CREATININE 1.10 08/07/2017   BUN 24 (H) 08/07/2017   CO2 31 08/07/2017   TSH 1.37 08/07/2017   PSA 1.30 08/07/2017   HGBA1C 5.9 08/07/2017    BP Readings from Last 3 Encounters:  08/07/17 128/80  08/04/16 130/90  08/03/15 134/80    ASSESSMENT AND PLAN:  Discussed the following assessment and plan:  Visit for preventive health examination - Plan: Basic metabolic panel, CBC with Differential/Platelet, Hemoglobin A1c, Hepatic function panel, Lipid panel, TSH, PSA  Medication management - Plan: Basic metabolic panel, CBC with Differential/Platelet, Hemoglobin A1c, Hepatic function panel, Lipid panel, TSH  Essential hypertension - controlled  - Plan: Basic metabolic panel, CBC with Differential/Platelet, Hemoglobin A1c, Hepatic function panel, Lipid panel, TSH  Family history of diabetes mellitus - Plan: Basic metabolic panel, CBC with Differential/Platelet, Hemoglobin A1c, Hepatic function panel, Lipid panel, TSH  Vitiligo - Plan: Basic metabolic panel, CBC with Differential/Platelet, Hemoglobin A1c, Hepatic function panel, Lipid panel, TSH  Screening PSA (prostate specific antigen) - Plan: PSA  ACE-inhibitor cough - change med to losartan and fu   see text  Wax in ear  Pre-diabetes - controlled life style no  sx  Will complete from for [place] excursions  . Patient Care Team: Melquan Ernsberger, Neta Mends, MD as PCP - General (Internal Medicine) Nyoka Cowden, MD as Consulting Physician (Pulmonary Disease) Dorisann Frames, MD as Consulting Physician (Endocrinology) [place], Amy, MD as Consulting Physician (Dermatology) Patient Instructions  Your exam is normal  Will notify you  of labs when available.  Wax in left ear .  Continue lifestyle intervention healthy eating and exercise .   Will change  Your BP med    And cough should subside in 2-4 weeks or less . Let us know if bp not at goal or cough continues .   bp goal around 120/80 range  Or at least 130/80.      Health Maintenance, Male A healthy lifestyle and preventive care is important for your health and wellness. Ask your health care provider about what schedule of regular examinations is right for you. What should I know about weight and diet? Eat a Healthy Diet  Eat plenty of vegetables, fruits, whole grains, low-fat dairy products, and lean protein.  Do not eat a lot of foods high in solid fats, added sugars, or salt.  Maintain a Healthy Weight Regular exercise can help you achieve or maintain a healthy weight. You should:  Do at least 150 minutes of exercise each week. The exercise should increase your heart rate and make you sweat (moderate-intensity exercise).  Do strength-training exercises at least twice a week.  Watch Your Levels of Cholesterol and Blood Lipids  Have your blood tested for lipids and cholesterol every 5 years starting at 60 years of age. If you are at high risk for heart disease, you should start having your blood tested when you are 60 years old. You may need to have your cholesterol levels checked more often if: ? Your lipid or cholesterol levels are high. ? You are older than 60 years of age. ? You are at high risk for heart disease.  What should I know about cancer screening? Many types of cancers can be detected early and may often be prevented. Lung  Cancer  You should be screened every year for lung cancer if: ? You are a current smoker who has smoked for at least 30 years. ? You are a former smoker who has quit within the past 15 years.  Talk to your health care provider about your screening options, when you should start screening, and how often you should be screened.  Colorectal Cancer  Routine colorectal cancer screening usually begins at 60 years of age and should be repeated every 5-10 years until you are 60 years old. You may need to be screened more often if early forms of precancerous polyps or small growths are found. Your health care provider may recommend screening at an earlier age if you have risk factors for colon cancer.  Your health care provider may recommend using home test kits to check for hidden blood in the stool.  A small camera at the end of a tube can be used to examine your colon (sigmoidoscopy or colonoscopy). This checks for the earliest forms of colorectal cancer.  Prostate and Testicular Cancer  Depending on your age and overall health, your health care provider may do certain tests to screen for prostate and testicular cancer.  Talk to your health care provider about any symptoms or concerns you have about testicular or prostate cancer.  Skin Cancer  Check your skin from head to toe regularly.  Tell your health care provider about any new moles or changes in moles, especially if: ? There is a change in a mole's size, shape, or color. ? You have a mole that is larger than a pencil eraser.  Always use sunscreen. Apply sunscreen liberally and repeat throughout the day.  Protect yourself by wearing long sleeves, pants, a wide-brimmed hat, and sunglasses when outside.  What should I know about heart disease, diabetes, and high blood pressure?  If you are 28-33 years of age, have your blood pressure checked every 3-5 years. If you are 100 years of age or older, have your blood pressure checked every  year. You should have your blood pressure measured twice-once when you are at a hospital or clinic, and once when you are not at a hospital or clinic. Record the average of the two measurements. To check your blood pressure when you are not at a hospital or clinic, you can use: ? An automated blood pressure machine at a pharmacy. ? A home blood pressure monitor.  Talk to your health care provider about your target blood pressure.  If you are between 65-26 years old, ask your health care provider if you should take aspirin to prevent heart disease.  Have regular diabetes screenings by checking your fasting blood sugar level. ? If you are at a normal weight and have a low risk for diabetes, have this test once every three years after the age of 33. ? If you are overweight and have a high risk for diabetes, consider being tested at a younger age or more often.  A one-time screening for abdominal aortic aneurysm (AAA) by ultrasound is recommended for men aged 65-75 years who are current or former smokers. What should I know about preventing infection? Hepatitis B If you have a higher risk for hepatitis B, you should be screened for this virus. Talk with your health care provider to find out if you are at risk for hepatitis B infection. Hepatitis C Blood testing is recommended for:  Everyone born from [birth year] through 1963.  Anyone with known risk factors for hepatitis C.  Sexually  Transmitted Diseases (STDs)  You should be screened each year for STDs including gonorrhea and chlamydia if: ? You are sexually active and are younger than 60 years of age. ? You are older than 60 years of age and your health care provider tells you that you are at risk for this type of infection. ? Your sexual activity has changed since you were last screened and you are at an increased risk for chlamydia or gonorrhea. Ask your health care provider if you are at risk.  Talk with your health care provider about  whether you are at high risk of being infected with HIV. Your health care provider may recommend a prescription medicine to help prevent HIV infection.  What else can I do?  Schedule regular health, dental, and eye exams.  Stay current with your vaccines (immunizations).  Do not use any tobacco products, such as cigarettes, chewing tobacco, and e-cigarettes. If you need help quitting, ask your health care provider.  Limit alcohol intake to no more than 2 drinks per day. One drink equals 12 ounces of beer, 5 ounces of wine, or 1 ounces of hard liquor.  Do not use street drugs.  Do not share needles.  Ask your health care provider for help if you need support or information about quitting drugs.  Tell your health care provider if you often feel depressed.  Tell your health care provider if you have ever been abused or do not feel safe at home. This information is not intended to replace advice given to you by your health care provider. Make sure you discuss any questions you have with your health care provider. Document Released: 06/17/2007 Document Revised: 08/18/2015 Document Reviewed: 09/22/2014 Elsevier Interactive Patient Education  2018 [employer]    CommerceWanda K. Sehaj Kolden M.D.

## 2017-08-07 ENCOUNTER — Encounter: Payer: Self-pay | Admitting: Internal Medicine

## 2017-08-07 ENCOUNTER — Ambulatory Visit (INDEPENDENT_AMBULATORY_CARE_PROVIDER_SITE_OTHER): Payer: BLUE CROSS/BLUE SHIELD | Admitting: Internal Medicine

## 2017-08-07 VITALS — BP 128/80 | HR 57 | Temp 98.6°F | Ht 69.5 in | Wt 207.6 lb

## 2017-08-07 DIAGNOSIS — Z125 Encounter for screening for malignant neoplasm of prostate: Secondary | ICD-10-CM | POA: Diagnosis not present

## 2017-08-07 DIAGNOSIS — L80 Vitiligo: Secondary | ICD-10-CM | POA: Diagnosis not present

## 2017-08-07 DIAGNOSIS — Z0000 Encounter for general adult medical examination without abnormal findings: Secondary | ICD-10-CM | POA: Diagnosis not present

## 2017-08-07 DIAGNOSIS — T464X5A Adverse effect of angiotensin-converting-enzyme inhibitors, initial encounter: Secondary | ICD-10-CM

## 2017-08-07 DIAGNOSIS — R05 Cough: Secondary | ICD-10-CM

## 2017-08-07 DIAGNOSIS — H6120 Impacted cerumen, unspecified ear: Secondary | ICD-10-CM

## 2017-08-07 DIAGNOSIS — Z79899 Other long term (current) drug therapy: Secondary | ICD-10-CM | POA: Diagnosis not present

## 2017-08-07 DIAGNOSIS — Z833 Family history of diabetes mellitus: Secondary | ICD-10-CM | POA: Diagnosis not present

## 2017-08-07 DIAGNOSIS — R7303 Prediabetes: Secondary | ICD-10-CM

## 2017-08-07 DIAGNOSIS — I10 Essential (primary) hypertension: Secondary | ICD-10-CM | POA: Diagnosis not present

## 2017-08-07 DIAGNOSIS — R058 Other specified cough: Secondary | ICD-10-CM

## 2017-08-07 LAB — LIPID PANEL
CHOL/HDL RATIO: 4
Cholesterol: 144 mg/dL (ref 0–200)
HDL: 40.1 mg/dL (ref >39.00)
LDL CALC: 84 mg/dL (ref 0–99)
NONHDL: 103.51
TRIGLYCERIDES: 97 mg/dL (ref 0.0–149.0)
VLDL: 19.4 mg/dL (ref 0.0–40.0)

## 2017-08-07 LAB — CBC WITH DIFFERENTIAL/PLATELET
BASOS ABS: 0 10*3/uL (ref 0.0–0.1)
Basophils Relative: 0.8 % (ref 0.0–3.0)
EOS ABS: 0.1 10*3/uL (ref 0.0–0.7)
Eosinophils Relative: 2.3 % (ref 0.0–5.0)
HCT: 50.3 % (ref 39.0–52.0)
HEMOGLOBIN: 15.8 g/dL (ref 13.0–17.0)
LYMPHS PCT: 29.9 % (ref 12.0–46.0)
Lymphs Abs: 1.8 10*3/uL (ref 0.7–4.0)
MCHC: 31.4 g/dL (ref 30.0–36.0)
MCV: 79.9 fl (ref 78.0–100.0)
MONOS PCT: 11.9 % (ref 3.0–12.0)
Monocytes Absolute: 0.7 10*3/uL (ref 0.1–1.0)
Neutro Abs: 3.3 10*3/uL (ref 1.4–7.7)
Neutrophils Relative %: 55.1 % (ref 43.0–77.0)
PLATELETS: 103 10*3/uL — AB (ref 150.0–400.0)
RBC: 6.3 Mil/uL — AB (ref 4.22–5.81)
RDW: 15.2 % (ref 11.5–15.5)
WBC: 6 10*3/uL (ref 4.0–10.5)

## 2017-08-07 LAB — BASIC METABOLIC PANEL
BUN: 24 mg/dL — ABNORMAL HIGH (ref 6–23)
CALCIUM: 9.3 mg/dL (ref 8.4–10.5)
CHLORIDE: 101 meq/L (ref 96–112)
CO2: 31 mEq/L (ref 19–32)
CREATININE: 1.1 mg/dL (ref 0.40–1.50)
GFR: 72.65 mL/min (ref >60.00)
Glucose, Bld: 103 mg/dL — ABNORMAL HIGH (ref 70–99)
POTASSIUM: 4.6 meq/L (ref 3.5–5.1)
Sodium: 138 mEq/L (ref 135–145)

## 2017-08-07 LAB — HEMOGLOBIN A1C: Hgb A1c MFr Bld: 5.9 % (ref 4.6–6.5)

## 2017-08-07 LAB — HEPATIC FUNCTION PANEL
ALK PHOS: 66 U/L (ref 39–117)
ALT: 18 U/L (ref 0–53)
AST: 15 U/L (ref 0–37)
Albumin: 4 g/dL (ref 3.5–5.2)
BILIRUBIN TOTAL: 0.9 mg/dL (ref 0.2–1.2)
Bilirubin, Direct: 0.2 mg/dL (ref 0.0–0.3)
TOTAL PROTEIN: 6.5 g/dL (ref 6.0–8.3)

## 2017-08-07 LAB — TSH: TSH: 1.37 u[IU]/mL (ref 0.35–4.50)

## 2017-08-07 LAB — PSA: PSA: 1.3 ng/mL (ref 0.10–4.00)

## 2017-08-07 MED ORDER — LOSARTAN POTASSIUM 50 MG PO TABS: 50.0000 mg | ORAL_TABLET | Freq: Every day | ORAL | 3 refills | Status: DC

## 2017-08-07 NOTE — Patient Instructions (Addendum)
Your exam is normal  Will notify you  of labs when available.  Wax in left ear .  Continue lifestyle intervention healthy eating and exercise .   Will change  Your BP med    And cough should subside in 2-4 weeks or less . Let us know if bp not at goal or cough continues .   bp goal around 120/80 range  Or at least 130/80.      Health Maintenance, Male A healthy lifestyle and preventive care is important for your health and wellness. Ask your health care provider about what schedule of regular examinations is right for you. What should I know about weight and diet? Eat a Healthy Diet  Eat plenty of vegetables, fruits, whole grains, low-fat dairy products, and lean protein.  Do not eat a lot of foods high in solid fats, added sugars, or salt.  Maintain a Healthy Weight Regular exercise can help you achieve or maintain a healthy weight. You should:  Do at least 150 minutes of exercise each week. The exercise should increase your heart rate and make you sweat (moderate-intensity exercise).  Do strength-training exercises at least twice a week.  Watch Your Levels of Cholesterol and Blood Lipids  Have your blood tested for lipids and cholesterol every 5 years starting at 60 years of age. If you are at high risk for heart disease, you should start having your blood tested when you are 60 years old. You may need to have your cholesterol levels checked more often if: ? Your lipid or cholesterol levels are high. ? You are older than 59 years of age. ? You are at high risk for heart disease.  What should I know about cancer screening? Many types of cancers can be detected early and may often be prevented. Lung Cancer  You should be screened every year for lung cancer if: ? You are a current smoker who has smoked for at least 30 years. ? You are a former smoker who has quit within the past 15 years.  Talk to your health care provider about your screening options, when you should start  screening, and how often you should be screened.  Colorectal Cancer  Routine colorectal cancer screening usually begins at 60 years of age and should be repeated every 5-10 years until you are 60 years old. You may need to be screened more often if early forms of precancerous polyps or small growths are found. Your health care provider may recommend screening at an earlier age if you have risk factors for colon cancer.  Your health care provider may recommend using home test kits to check for hidden blood in the stool.  A small camera at the end of a tube can be used to examine your colon (sigmoidoscopy or colonoscopy). This checks for the earliest forms of colorectal cancer.  Prostate and Testicular Cancer  Depending on your age and overall health, your health care provider may do certain tests to screen for prostate and testicular cancer.  Talk to your health care provider about any symptoms or concerns you have about testicular or prostate cancer.  Skin Cancer  Check your skin from head to toe regularly.  Tell your health care provider about any new moles or changes in moles, especially if: ? There is a change in a mole's size, shape, or color. ? You have a mole that is larger than a pencil eraser.  Always use sunscreen. Apply sunscreen liberally and repeat throughout the day.  Protect yourself by wearing long sleeves, pants, a wide-brimmed hat, and sunglasses when outside.  What should I know about heart disease, diabetes, and high blood pressure?  If you are 3418-60 years of age, have your blood pressure checked every 3-5 years. If you are 60 years of age or older, have your blood pressure checked every year. You should have your blood pressure measured twice-once when you are at a hospital or clinic, and once when you are not at a hospital or clinic. Record the average of the two measurements. To check your blood pressure when you are not at a hospital or clinic, you can use: ? An  automated blood pressure machine at a pharmacy. ? A home blood pressure monitor.  Talk to your health care provider about your target blood pressure.  If you are between 5945-60 years old, ask your health care provider if you should take aspirin to prevent heart disease.  Have regular diabetes screenings by checking your fasting blood sugar level. ? If you are at a normal weight and have a low risk for diabetes, have this test once every three years after the age of 60. ? If you are overweight and have a high risk for diabetes, consider being tested at a younger age or more often.  A one-time screening for abdominal aortic aneurysm (AAA) by ultrasound is recommended for men aged 65-75 years who are current or former smokers. What should I know about preventing infection? Hepatitis B If you have a higher risk for hepatitis B, you should be screened for this virus. Talk with your health care provider to find out if you are at risk for hepatitis B infection. Hepatitis C Blood testing is recommended for:  Everyone born from [birth year] through 1965.  Anyone with known risk factors for hepatitis C.  Sexually Transmitted Diseases (STDs)  You should be screened each year for STDs including gonorrhea and chlamydia if: ? You are sexually active and are younger than 60 years of age. ? You are older than 60 years of age and your health care provider tells you that you are at risk for this type of infection. ? Your sexual activity has changed since you were last screened and you are at an increased risk for chlamydia or gonorrhea. Ask your health care provider if you are at risk.  Talk with your health care provider about whether you are at high risk of being infected with HIV. Your health care provider may recommend a prescription medicine to help prevent HIV infection.  What else can I do?  Schedule regular health, dental, and eye exams.  Stay current with your vaccines (immunizations).  Do not use  any tobacco products, such as cigarettes, chewing tobacco, and e-cigarettes. If you need help quitting, ask your health care provider.  Limit alcohol intake to no more than 2 drinks per day. One drink equals 12 ounces of beer, 5 ounces of wine, or 1 ounces of hard liquor.  Do not use street drugs.  Do not share needles.  Ask your health care provider for help if you need support or information about quitting drugs.  Tell your health care provider if you often feel depressed.  Tell your health care provider if you have ever been abused or do not feel safe at home. This information is not intended to replace advice given to you by your health care provider. Make sure you discuss any questions you have with your health care provider. Document Released: 06/17/2007 Document  Revised: 08/18/2015 Document Reviewed: 09/22/2014 Elsevier Interactive Patient Education  [employer].

## 2017-08-14 ENCOUNTER — Other Ambulatory Visit: Payer: Self-pay | Admitting: *Deleted

## 2017-08-14 ENCOUNTER — Encounter: Payer: Self-pay | Admitting: *Deleted

## 2017-08-14 DIAGNOSIS — D696 Thrombocytopenia, unspecified: Secondary | ICD-10-CM

## 2017-08-24 DIAGNOSIS — E119 Type 2 diabetes mellitus without complications: Secondary | ICD-10-CM | POA: Diagnosis not present

## 2017-09-17 ENCOUNTER — Other Ambulatory Visit (INDEPENDENT_AMBULATORY_CARE_PROVIDER_SITE_OTHER): Payer: BLUE CROSS/BLUE SHIELD

## 2017-09-17 DIAGNOSIS — D696 Thrombocytopenia, unspecified: Secondary | ICD-10-CM | POA: Diagnosis not present

## 2017-09-17 LAB — CBC WITH DIFFERENTIAL/PLATELET
BASOS PCT: 0.9 % (ref 0.0–3.0)
Basophils Absolute: 0 10*3/uL (ref 0.0–0.1)
Eosinophils Absolute: 0.2 10*3/uL (ref 0.0–0.7)
Eosinophils Relative: 3.4 % (ref 0.0–5.0)
HCT: 46.5 % (ref 39.0–52.0)
HEMOGLOBIN: 15.1 g/dL (ref 13.0–17.0)
LYMPHS PCT: 36.2 % (ref 12.0–46.0)
Lymphs Abs: 1.7 10*3/uL (ref 0.7–4.0)
MCHC: 32.4 g/dL (ref 30.0–36.0)
MCV: 77.2 fl — ABNORMAL LOW (ref 78.0–100.0)
MONOS PCT: 12.2 % — AB (ref 3.0–12.0)
Monocytes Absolute: 0.6 10*3/uL (ref 0.1–1.0)
Neutro Abs: 2.2 10*3/uL (ref 1.4–7.7)
Neutrophils Relative %: 47.3 % (ref 43.0–77.0)
Platelets: 134 10*3/uL — ABNORMAL LOW (ref 150.0–400.0)
RBC: 6.02 Mil/uL — ABNORMAL HIGH (ref 4.22–5.81)
RDW: 14.7 % (ref 11.5–15.5)
WBC: 4.6 10*3/uL (ref 4.0–10.5)

## 2017-10-15 DIAGNOSIS — H2513 Age-related nuclear cataract, bilateral: Secondary | ICD-10-CM | POA: Diagnosis not present

## 2017-11-22 DIAGNOSIS — H16012 Central corneal ulcer, left eye: Secondary | ICD-10-CM | POA: Diagnosis not present

## 2017-11-23 DIAGNOSIS — H16012 Central corneal ulcer, left eye: Secondary | ICD-10-CM | POA: Diagnosis not present

## 2017-11-26 DIAGNOSIS — H5712 Ocular pain, left eye: Secondary | ICD-10-CM | POA: Diagnosis not present

## 2017-11-26 DIAGNOSIS — H16012 Central corneal ulcer, left eye: Secondary | ICD-10-CM | POA: Diagnosis not present

## 2017-12-10 DIAGNOSIS — H1789 Other corneal scars and opacities: Secondary | ICD-10-CM | POA: Diagnosis not present

## 2017-12-11 DIAGNOSIS — H1789 Other corneal scars and opacities: Secondary | ICD-10-CM | POA: Diagnosis not present

## 2017-12-18 DIAGNOSIS — H1789 Other corneal scars and opacities: Secondary | ICD-10-CM | POA: Diagnosis not present

## 2018-01-25 DIAGNOSIS — H2513 Age-related nuclear cataract, bilateral: Secondary | ICD-10-CM | POA: Diagnosis not present

## 2018-01-25 DIAGNOSIS — H1789 Other corneal scars and opacities: Secondary | ICD-10-CM | POA: Diagnosis not present

## 2018-01-25 DIAGNOSIS — H524 Presbyopia: Secondary | ICD-10-CM | POA: Diagnosis not present

## 2018-03-13 DIAGNOSIS — L918 Other hypertrophic disorders of the skin: Secondary | ICD-10-CM | POA: Diagnosis not present

## 2018-03-13 DIAGNOSIS — L821 Other seborrheic keratosis: Secondary | ICD-10-CM | POA: Diagnosis not present

## 2018-03-13 DIAGNOSIS — D2261 Melanocytic nevi of right upper limb, including shoulder: Secondary | ICD-10-CM | POA: Diagnosis not present

## 2018-03-13 DIAGNOSIS — L80 Vitiligo: Secondary | ICD-10-CM | POA: Diagnosis not present

## 2018-07-25 ENCOUNTER — Other Ambulatory Visit: Payer: Self-pay | Admitting: Internal Medicine

## 2018-08-12 ENCOUNTER — Encounter: Payer: BLUE CROSS/BLUE SHIELD | Admitting: Internal Medicine

## 2018-08-14 DIAGNOSIS — Z125 Encounter for screening for malignant neoplasm of prostate: Secondary | ICD-10-CM | POA: Diagnosis not present

## 2018-08-14 DIAGNOSIS — Z139 Encounter for screening, unspecified: Secondary | ICD-10-CM | POA: Diagnosis not present

## 2018-08-14 DIAGNOSIS — E119 Type 2 diabetes mellitus without complications: Secondary | ICD-10-CM | POA: Diagnosis not present

## 2018-08-14 LAB — BASIC METABOLIC PANEL
BUN: 17 (ref 4–21)
Creatinine: 1.1 (ref 0.6–1.3)
Glucose: 86
Potassium: 4.6 (ref 3.4–5.3)
Sodium: 141 (ref 137–147)

## 2018-08-14 LAB — LIPID PANEL
Cholesterol: 171 (ref 0–200)
HDL: 43 (ref 35–70)
LDL Cholesterol: 105
Triglycerides: 115 (ref 40–160)

## 2018-08-14 LAB — CBC AND DIFFERENTIAL
HCT: 45 (ref 41–53)
Hemoglobin: 15.3 (ref 13.5–17.5)
Platelets: 140 — AB (ref 150–399)
WBC: 5.2

## 2018-08-14 LAB — PSA: PSA: 1.59

## 2018-08-17 ENCOUNTER — Encounter: Payer: Self-pay | Admitting: Internal Medicine

## 2018-10-18 ENCOUNTER — Other Ambulatory Visit: Payer: Self-pay

## 2018-10-18 ENCOUNTER — Telehealth (INDEPENDENT_AMBULATORY_CARE_PROVIDER_SITE_OTHER): Payer: BC Managed Care – PPO | Admitting: Internal Medicine

## 2018-10-18 ENCOUNTER — Encounter: Payer: Self-pay | Admitting: Internal Medicine

## 2018-10-18 VITALS — Wt 192.0 lb

## 2018-10-18 DIAGNOSIS — I10 Essential (primary) hypertension: Secondary | ICD-10-CM | POA: Diagnosis not present

## 2018-10-18 DIAGNOSIS — Z833 Family history of diabetes mellitus: Secondary | ICD-10-CM | POA: Diagnosis not present

## 2018-10-18 DIAGNOSIS — Z79899 Other long term (current) drug therapy: Secondary | ICD-10-CM

## 2018-10-18 MED ORDER — LOSARTAN POTASSIUM 50 MG PO TABS: 50.0000 mg | ORAL_TABLET | Freq: Every day | ORAL | 3 refills | Status: DC

## 2018-10-18 NOTE — Progress Notes (Signed)
Virtual Visit via Video Note  I connected with@ on 10/18/18 at  3:00 PM EDT by a video enabled telemedicine application and verified that I am speaking with the correct person using two identifiers. Location patient: home Location provider:workoffice Persons participating in the virtual visit: patient, provider  WIth national recommendations  regarding COVID 19 pandemic   video visit is advised over in office visit for this patient.  Patient aware  of the limitations of evaluation and management by telemedicine and  availability of in person appointments. and agreed to proceed.   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  He will continue to see Dr. Michiel Sites yearly at risk for diabetes.  ROS: See pertinent positives and negatives per HPI.  No chest pain shortness of breath cardiovascular symptoms.  Past Medical History:  Diagnosis Date  . H/O left knee surgery    MCL 2008 Dr. Maureen Ralphs  . History of exercise stress test 2015   exercise cardiopulmonary  test  . History of varicella   . Vitiligo    when younger no treatment doesn't run in families.    Past Surgical History:  Procedure Laterality Date  . CARDIOVASCULAR STRESS TEST    . KNEE ARTHROSCOPY      Family History  Problem Relation Age of Onset  . Hypertension Mother   . Diabetes Mellitus II Mother   . Heart disease Unknown        maternal uncle in his 77s    Social History   Tobacco Use  . Smoking status: Never Smoker  . Smokeless tobacco: Never Used  Substance Use Topics  . Alcohol use: No  . Drug use: No      Current Outpatient  Medications:  .  cetirizine (ZYRTEC) 10 MG tablet, Take 10 mg by mouth daily., Disp: , Rfl:  .  losartan (COZAAR) 50 MG tablet, Take 1 tablet (50 mg total) by mouth daily., Disp: 90 tablet, Rfl: 3  EXAM: BP Readings from Last 3 Encounters:  08/07/17 128/80  08/04/16 130/90  08/03/15 134/80    VITALS per patient if applicable: 025   bp 852/77 range   GENERAL: alert, oriented, appears well and in no acute distress  HEENT: atraumatic, conjunttiva clear, no obvious abnormalities on inspection of external nose and ears  NECK: normal movements of the head and neck  LUNGS: on inspection no signs of respiratory distress, breathing rate appears normal, no obvious gross SOB, gasping or wheezing  CV: no obvious cyanosis  MS: moves all visible extremities without noticeable abnormality  PSYCH/NEURO: pleasant and cooperative, no obvious depression or anxiety, speech and thought processing grossly intact Lab Results  Component Value Date   WBC 5.2 08/14/2018   HGB 15.3 08/14/2018   HCT 45 08/14/2018   PLT 140 (A) 08/14/2018   GLUCOSE 103 (H) 08/07/2017   CHOL 171 08/14/2018   TRIG 115 08/14/2018   HDL 43 08/14/2018   LDLCALC 105 08/14/2018   ALT 18 08/07/2017   AST 15 08/07/2017   NA  141 08/14/2018   K 4.6 08/14/2018   CL 101 08/07/2017   CREATININE 1.1 08/14/2018   BUN 17 08/14/2018   CO2 31 08/07/2017   TSH 1.37 08/07/2017   PSA 1.59 08/14/2018   HGBA1C 5.9 08/07/2017   Laboratory studies done at Dr. Valinda Hoar office.  Dated August 14, 2018 shows a normal chemistry panel platelet count 140 H&H 15.340 5.4 total cholesterol 171 HDL 43 LDL 105 ratio 2.4 PSA 1.59 normal microalbumin ratio.  Creatinine is 1.1.  Sent to scan.  ASSESSMENT AND PLAN:  Discussed the following assessment and plan:    ICD-10-CM   1. Essential hypertension  I10   2. Medication management  Z79.899   3. Family history of diabetes mellitus  Z83.3   He is doing quite well is up-to-date on his parameters  had the flu vaccine refill losartan for a year CPX next year monitoring labs.  Follow-up as needed in the interim.   Counseled.   Expectant management and discussion of plan and treatment with opportunity to ask questions and all were answered. The patient agreed with the plan and demonstrated an understanding of the instructions.   Advised to call back or seek an in-person evaluation if worsening  or having  further concerns .     Berniece Andreas, MD

## 2018-10-28 DIAGNOSIS — H43812 Vitreous degeneration, left eye: Secondary | ICD-10-CM | POA: Diagnosis not present

## 2018-11-25 DIAGNOSIS — H43811 Vitreous degeneration, right eye: Secondary | ICD-10-CM | POA: Diagnosis not present

## 2019-03-18 DIAGNOSIS — Z20828 Contact with and (suspected) exposure to other viral communicable diseases: Secondary | ICD-10-CM | POA: Diagnosis not present

## 2019-04-15 ENCOUNTER — Encounter: Payer: Self-pay | Admitting: Gastroenterology

## 2019-05-07 DIAGNOSIS — L821 Other seborrheic keratosis: Secondary | ICD-10-CM | POA: Diagnosis not present

## 2019-05-07 DIAGNOSIS — D225 Melanocytic nevi of trunk: Secondary | ICD-10-CM | POA: Diagnosis not present

## 2019-05-07 DIAGNOSIS — D1801 Hemangioma of skin and subcutaneous tissue: Secondary | ICD-10-CM | POA: Diagnosis not present

## 2019-05-07 DIAGNOSIS — L918 Other hypertrophic disorders of the skin: Secondary | ICD-10-CM | POA: Diagnosis not present

## 2019-05-23 ENCOUNTER — Ambulatory Visit (AMBULATORY_SURGERY_CENTER): Payer: Self-pay

## 2019-05-23 ENCOUNTER — Other Ambulatory Visit: Payer: Self-pay

## 2019-05-23 VITALS — Ht 69.5 in | Wt 205.4 lb

## 2019-05-23 DIAGNOSIS — Z1211 Encounter for screening for malignant neoplasm of colon: Secondary | ICD-10-CM

## 2019-05-23 MED ORDER — SUTAB 1479-225-188 MG PO TABS: 12.0000 | ORAL_TABLET | ORAL | 0 refills | Status: DC

## 2019-05-23 NOTE — Progress Notes (Signed)
No allergies to soy or egg Pt is not on blood thinners or diet pills Denies issues with sedation/intubation Denies atrial flutter/fib Denies constipation   Emmi instructions given to pt  Pt is aware of Covid safety and care partner requirements.  

## 2019-06-04 ENCOUNTER — Encounter: Payer: Self-pay | Admitting: Gastroenterology

## 2019-06-11 ENCOUNTER — Encounter: Payer: Self-pay | Admitting: Gastroenterology

## 2019-06-11 ENCOUNTER — Other Ambulatory Visit: Payer: Self-pay

## 2019-06-11 ENCOUNTER — Ambulatory Visit (AMBULATORY_SURGERY_CENTER): Payer: BC Managed Care – PPO | Admitting: Gastroenterology

## 2019-06-11 VITALS — BP 113/75 | HR 52 | Temp 98.7°F | Resp 12 | Ht 69.5 in | Wt 205.4 lb

## 2019-06-11 DIAGNOSIS — Z1211 Encounter for screening for malignant neoplasm of colon: Secondary | ICD-10-CM | POA: Diagnosis not present

## 2019-06-11 MED ORDER — SODIUM CHLORIDE 0.9 % IV SOLN
500.0000 mL | Freq: Once | INTRAVENOUS | Status: DC
Start: 2019-06-11 — End: 2019-06-11

## 2019-06-11 NOTE — Progress Notes (Signed)
Pt's states no medical or surgical changes since previsit or office visit.  VS CW  

## 2019-06-11 NOTE — Patient Instructions (Signed)
Everything was normal.  Please read all of the handouts given to you by your recovery room nurse.  YOU HAD AN ENDOSCOPIC PROCEDURE TODAY AT THE [facility]:   Refer to the procedure report that was given to you for any specific questions about what was found during the examination.  If the procedure report does not answer your questions, please call your gastroenterologist to clarify.  If you requested that your care partner not be given the details of your procedure findings, then the procedure report has been included in a sealed envelope for you to review at your convenience later.  YOU SHOULD EXPECT: Some feelings of bloating in the abdomen. Passage of more gas than usual.  Walking can help get rid of the air that was put into your GI tract during the procedure and reduce the bloating. If you had a lower endoscopy (such as a colonoscopy or flexible sigmoidoscopy) you may notice spotting of blood in your stool or on the toilet paper. If you underwent a bowel prep for your procedure, you may not have a normal bowel movement for a few days.  Please Note:  You might notice some irritation and congestion in your nose or some drainage.  This is from the oxygen used during your procedure.  There is no need for concern and it should clear up in a day or so.  SYMPTOMS TO REPORT IMMEDIATELY:   Following lower endoscopy (colonoscopy or flexible sigmoidoscopy):  Excessive amounts of blood in the stool  Significant tenderness or worsening of abdominal pains  Swelling of the abdomen that is new, acute  Fever of 100F or higher   For urgent or emergent issues, a gastroenterologist can be reached at any hour by calling [phone]. Do not use MyChart messaging for urgent concerns.    DIET:  We do recommend a small meal at first, but then you may proceed to your regular diet.  Drink plenty of fluids but you should avoid alcoholic beverages for 24 hours. Try to increase the fiber in your  diet, and drink at leasdt 64 oz of water per day.  Reduce red meet in your diet, and limit alcohol.  ACTIVITY:  You should plan to take it easy for the rest of today and you should NOT DRIVE or use heavy machinery until tomorrow (because of the sedation medicines used during the test).    FOLLOW UP: Our staff will call the number listed on your records 48-72 hours following your procedure to check on you and address any questions or concerns that you may have regarding the information given to you following your procedure. If we do not reach you, we will leave a message.  We will attempt to reach you two times.  During this call, we will ask if you have developed any symptoms of COVID 19. If you develop any symptoms (ie: fever, flu-like symptoms, shortness of breath, cough etc.) before then, please call [phone].  If you test positive for Covid 19 in the 2 weeks post procedure, please call and report this information to [place].    If any biopsies were taken you will be contacted by phone or by letter within the next 1-3 weeks.  Please call us at [phone] if you have not heard about the biopsies in 3 weeks.    SIGNATURES/CONFIDENTIALITY: You and/or your care partner have signed paperwork which will be entered into your electronic medical record.  These signatures attest to the fact that that the  information above on your After Visit Summary has been reviewed and is understood.  Full responsibility of the confidentiality of this discharge information lies with you and/or your care-partner.

## 2019-06-11 NOTE — Progress Notes (Signed)
pt tolerated well. VSS. awake and to recovery. Report given to RN.  

## 2019-06-11 NOTE — Op Note (Signed)
[facility] Patient Name: Jake Delgado Procedure Date: 06/11/2019 10:07 AM MRN: [id number] Endoscopist: Tressia Danas MD, MD Age: 64 Referring MD:  Date of Birth: 1955-06-22 Gender: Male Account #: [id number] Procedure:                Colonoscopy Indications:              Screening for colorectal malignant neoplasm                           Normal screening colonoscopy with Dr. Juanda Chance 02/2009                           No known family history of colon cancer or polyps Medicines:                Monitored Anesthesia Care Procedure:                Pre-Anesthesia Assessment:                           - Prior to the procedure, a History and Physical                            was performed, and patient medications and                            allergies were reviewed. The patient's tolerance of                            previous anesthesia was also reviewed. The risks                            and benefits of the procedure and the sedation                            options and risks were discussed with the patient.                            All questions were answered, and informed consent                            was obtained. Prior Anticoagulants: The patient has                            taken no previous anticoagulant or antiplatelet                            agents. ASA Grade Assessment: II - A patient with                            mild systemic disease. After reviewing the risks                            and benefits, the patient was deemed in  satisfactory condition to undergo the procedure.                           After obtaining informed consent, the colonoscope                            was passed under direct vision. Throughout the                            procedure, the patient's blood pressure, pulse, and                            oxygen saturations were monitored continuously. The                            Colonoscope  was introduced through the anus and                            advanced to the 3 cm into the ileum. A second                            forward view of the right colon was performed. The                            colonoscopy was performed without difficulty. The                            patient tolerated the procedure well. The quality                            of the bowel preparation was adequate to identify                            polyps. The terminal ileum, ileocecal valve,                            appendiceal orifice, and rectum were photographed. Scope In: 10:12:42 AM Scope Out: 10:28:08 AM Scope Withdrawal Time: 0 hours 11 minutes 13 seconds  Total Procedure Duration: 0 hours 15 minutes 26 seconds  Findings:                 The perianal and digital rectal examinations were                            normal.                           Non-bleeding internal hemorrhoids were found. The                            hemorrhoids were small.                           The exam was otherwise without abnormality on  direct and retroflexion views. Complications:            No immediate complications. Estimated blood loss:                            Minimal. Estimated Blood Loss:     Estimated blood loss was minimal. Impression:               - Non-bleeding internal hemorrhoids.                           - The examination was otherwise normal on direct                            and retroflexion views.                           - No specimens collected. Recommendation:           - Patient has a contact number available for                            emergencies. The signs and symptoms of potential                            delayed complications were discussed with the                            patient. Return to normal activities tomorrow.                            Written discharge instructions were provided to the                            patient.                            - Resume previous diet.                           - Continue present medications.                           - Repeat colonoscopy in 10 years for screening                            purposes.                           - Emerging evidence supports eating a diet of                            fruits, vegetables, grains, calcium, and yogurt                            while reducing red meat and alcohol may reduce the  risk of colon cancer.                           - Thank you for allowing me to be involved in your                            colon cancer prevention. Tressia Danas MD, MD 06/11/2019 10:34:10 AM This report has been signed electronically.

## 2019-06-11 NOTE — Progress Notes (Signed)
VO Lidocaine 2% 1mL with propofol for pt comfort. °

## 2019-06-13 ENCOUNTER — Telehealth: Payer: Self-pay

## 2019-06-13 NOTE — Telephone Encounter (Signed)
  Follow up Call-  Call back number 06/11/2019  Post procedure Call Back phone  # [phone]  Permission to leave phone message Yes  Some recent data might be hidden     Patient questions:  Do you have a fever, pain , or abdominal swelling? No. Pain Score  0 *  Have you tolerated food without any problems? Yes.    Have you been able to return to your normal activities? Yes.    Do you have any questions about your discharge instructions: Diet   No. Medications  No. Follow up visit  No.  Do you have questions or concerns about your Care? No.  Actions: * If pain score is 4 or above: No action needed, pain <4.  1. Have you developed a fever since your procedure? no  2.   Have you had an respiratory symptoms (SOB or cough) since your procedure? no  3.   Have you tested positive for COVID 19 since your procedure no  4.   Have you had any family members/close contacts diagnosed with the COVID 19 since your procedure?  no   If yes to any of these questions please route to Laverna Peace, RN and Charlett Lango, RN

## 2019-07-24 ENCOUNTER — Other Ambulatory Visit: Payer: BC Managed Care – PPO

## 2019-07-24 ENCOUNTER — Ambulatory Visit: Payer: BC Managed Care – PPO

## 2019-07-24 DIAGNOSIS — M13862 Other specified arthritis, left knee: Secondary | ICD-10-CM | POA: Diagnosis not present

## 2019-07-24 DIAGNOSIS — M1712 Unilateral primary osteoarthritis, left knee: Secondary | ICD-10-CM | POA: Diagnosis not present

## 2019-07-24 DIAGNOSIS — M25562 Pain in left knee: Secondary | ICD-10-CM

## 2019-08-11 NOTE — Progress Notes (Signed)
Chief Complaint  Patient presents with  . Annual Exam    Doing well    HPI: Patient  Jake Delgado  64 y.o. comes in today for Preventive Health Care visit   Losartan  BP has been good   120 or less .  Balan to be see soon .  Left knee  Pain   Had   Effusion.   Dr Despina Hick no specific injury but is active  Had colon and ok   Eye  Floater sx  Poss r eye. Skin dr [place] no new concerns  fam hx mom side older but athelte getting  cabg in [place]      Health Maintenance  Topic Date Due  . INFLUENZA VACCINE  08/03/2019  . TETANUS/TDAP  01/14/2024  . COLONOSCOPY  06/10/2029  . COVID-19 Vaccine  Completed  . Hepatitis C Screening  Completed  . HIV Screening  Completed   Health Maintenance Review LIFESTYLE:  Exercise:   A lot   Biking and kayaking .   Tobacco/ETS: Alcohol:  Sugar beverages: Sleep:8  Drug use: no HH of  2   No pets  Work:6 per day    And  Managing .   ROS:  GEN/ HEENT: No fever, significant weight changes sweats headaches vision problems hearing changes, CV/ PULM; No chest pain shortness of breath cough, syncope,edema  change in exercise tolerance. GI /GU: No adominal pain, vomiting, change in bowel habits. No blood in the stool. No significant GU symptoms. Nocturia x 1 but sleeping more hours  SKIN/HEME: ,no acute skin rashes suspicious lesions or bleeding. No lymphadenopathy, nodules, masses.  Has vitiligo  NEURO/ PSYCH:  No neurologic signs such as weakness numbness. No depression anxiety. IMM/ Allergy: No unusual infections.  Allergy .   REST of 12 system review negative except as per HPI   Past Medical History:  Diagnosis Date  . Allergy    seasonal  . H/O left knee surgery    MCL 2008 Dr. Despina Hick  . History of exercise stress test 2015   exercise cardiopulmonary  test  . History of varicella   . Hypertension   . Sleep apnea    was tested-no issues found.  . Vitiligo    when younger no treatment doesn't run in families.    Past Surgical History:    Procedure Laterality Date  . CARDIOVASCULAR STRESS TEST    . COLONOSCOPY  2011  . dental implants    . KNEE ARTHROSCOPY      Family History  Problem Relation Age of Onset  . Hypertension Mother   . Diabetes Mellitus II Mother   . Heart disease Other        maternal uncle in his 48s  . Colon cancer Neg Hx   . Colon polyps Neg Hx   . Esophageal cancer Neg Hx   . Stomach cancer Neg Hx   . Rectal cancer Neg Hx     Social History   Socioeconomic History  . Marital status: Married    Spouse name: Not on file  . Number of children: Not on file  . Years of education: Not on file  . Highest education level: Not on file  Occupational History  . Occupation: Surveyor, minerals  Tobacco Use  . Smoking status: Never Smoker  . Smokeless tobacco: Never Used  Vaping Use  . Vaping Use: Never used  Substance and Sexual Activity  . Alcohol use: No  . Drug use: No  . Sexual activity:  Not on file  Other Topics Concern  . 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   Give ETS FA   Vegetarian but  Pos dairly and sometimes meat   Social Determinants of Health   Financial Resource Strain:   . Difficulty of Paying Living Expenses:   Food Insecurity:   . Worried About Programme researcher, broadcasting/film/video in the Last Year:   . Barista in the Last Year:   Transportation Needs:   . Freight forwarder (Medical):   Marland Kitchen Lack of Transportation (Non-Medical):   Physical Activity:   . Days of Exercise per Week:   . Minutes of Exercise per Session:   Stress:   . Feeling of Stress :   Social Connections:   . Frequency of Communication with Friends and Family:   . Frequency of Social Gatherings with Friends and Family:   . Attends Religious  Services:   . Active Member of Clubs or Organizations:   . Attends Banker Meetings:   Marland Kitchen Marital Status:     Outpatient Medications Prior to Visit  Medication Sig Dispense Refill  . losartan (COZAAR) 50 MG tablet Take 1 tablet (50 mg total) by mouth daily. 90 tablet 3  . cetirizine (ZYRTEC) 10 MG tablet Take 10 mg by mouth daily. (Patient not taking: Reported on 08/12/2019)     No facility-administered medications prior to visit.     EXAM:  BP 110/76   Pulse (!) 52   Temp (!) 97.5 F (36.4 C) (Oral)   Ht 5' 10.5" (1.791 m)   Wt 196 lb 9.6 oz (89.2 kg)   SpO2 97%   BMI 27.81 kg/m   Body mass index is 27.81 kg/m. Wt Readings from Last 3 Encounters:  08/12/19 196 lb 9.6 oz (89.2 kg)  06/11/19 205 lb 6.4 oz (93.2 kg)  05/23/19 205 lb 6.4 oz (93.2 kg)    Physical Exam: Vital signs reviewed ZOX:WRUE is a well-developed well-nourished alert cooperative    who appearsr stated age in no acute distress.  HEENT: normocephalic atraumatic , Eyes: PERRL EOM's full, conjunctiva clear, Nares: paten,t no deformity discharge or tenderness., Ears: no deformity EAC's clear  Min wax TMs with normal landmarks. Mouth: masked NECK: supple without masses, thyromegaly or bruits. CHEST/PULM:  Clear to auscultation and percussion breath sounds equal no wheeze , rales or rhonchi. No chest wall deformities or tenderness. CV: PMI is nondisplaced, S1 S2 no gallops, murmurs, rubs. Peripheral pulses are full without delay.No JVD .  ABDOMEN: Bowel sounds normal nontender  No guard or rebound, no hepato splenomegal no CVA tenderness.  No hernia. Extremtities:  No clubbing cyanosis or edema, no acute joint swelling or redness no focal atrophy good muscle mass  NEURO:  Oriented x3, cranial nerves 3-12 appear to be intact, no obvious focal weakness,gait within normal limits no abnormal reflexes or asymmetrical SKIN: No acute rashes normal turgor, color, no bruising or petechiae.  Depigmentation   vitiliginous  PSYCH: Oriented, good eye contact, no obvious depression anxiety, cognition and judgment appear normal. LN: no cervical axillary inguinal adenopathy  Lab Results  Component Value Date   WBC 5.2  08/14/2018   HGB 15.3 08/14/2018   HCT 45 08/14/2018   PLT 140 (A) 08/14/2018   GLUCOSE 103 (H) 08/07/2017   CHOL 171 08/14/2018   TRIG 115 08/14/2018   HDL 43 08/14/2018   LDLCALC 105 08/14/2018   ALT 18 08/07/2017   AST 15 08/07/2017   NA 141 08/14/2018   K 4.6 08/14/2018   CL 101 08/07/2017   CREATININE 1.1 08/14/2018   BUN 17 08/14/2018   CO2 31 08/07/2017   TSH 1.37 08/07/2017   PSA 1.59 08/14/2018   HGBA1C 5.9 08/07/2017   IMPRESSION:  No evidence of abdominal aortic aneurysm. Mild prominence of the  proximal abdominal aorta measuring 2.5 cm. Recommend follow-up  ultrasound in 5 years.   This recommendation follows ACR consensus guidelines: White Paper of  the ACR Incidental Findings Committee II on Vascular Findings. J Am  Coll Radiol [phone].    Electronically Signed  By: Elberta Fortis M.D.  On: 06/07/2013 14:02  BP Readings from Last 3 Encounters:  08/12/19 110/76  06/11/19 113/75  08/07/17 128/80    Lab plan  reviewed with patient   ASSESSMENT AND PLAN:  Discussed the following assessment and plan:    ICD-10-CM   1. 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 Visit for preventive health examination  Z00.00 Basic metabolic panel    CBC with Differential/Platelet    Hemoglobin A1c    Hepatic function panel    Lipid panel    TSH    PSA  3. Family history of diabetes mellitus  Z83.3 Basic metabolic panel    CBC with Differential/Platelet    Hemoglobin A1c    Hepatic function panel    Lipid panel    TSH  4. Essential hypertension  I10 Basic metabolic panel    CBC with Differential/Platelet    Hemoglobin A1c    Hepatic function panel    Lipid panel    TSH   controlled   5. Medication management  Z79.899 Basic metabolic panel     CBC with Differential/Platelet    Hemoglobin A1c    Hepatic function panel    Lipid panel    TSH  6. Hyperlipidemia, unspecified hyperlipidemia type  E78.5 Basic metabolic panel    CBC with Differential/Platelet    Hemoglobin A1c    Hepatic function panel    Lipid panel    TSH  7. Screening PSA (prostate specific antigen)  Z12.5 PSA  fu  Aorta US as advised   Consider further cv risk assessment regarding statin med  And fam hx   Appears to have a   Favorable lifestyle   For cv health  Return for depending on results and yearly .  Patient Care Team: Sherl Yzaguirre, Neta Mends, MD as PCP - General (Internal Medicine) Nyoka Cowden, MD as Consulting Physician (Pulmonary Disease) Dorisann Frames, MD as Consulting Physician (Endocrinology) [place], Amy, MD as Consulting Physician (Dermatology) Patient Instructions  Will notify you  of labs when available.  Will arrange a screening abd aorta ultrasound as  Follow up Consideration  of  Coronary calcium score for risk assessment  For statin intervention .    Health Maintenance, Male Adopting a healthy lifestyle and getting preventive care are important in promoting health and wellness. Ask your health care provider about:  The right schedule for you to have regular tests and exams.  Things you can do on your own to prevent diseases and keep yourself healthy. What should I know about diet, weight,  and exercise? Eat a healthy diet   Eat a diet that includes plenty of vegetables, fruits, low-fat dairy products, and lean protein.  Do not eat a lot of foods that are high in solid fats, added sugars, or sodium. Maintain a healthy weight Body mass index (BMI) is a measurement that can be used to identify possible weight problems. It estimates body fat based on height and weight. Your health care provider can help determine your BMI and help you achieve or maintain a healthy weight. Get regular exercise Get regular exercise. This is one of the  most important things you can do for your health. Most adults should:  Exercise for at least 150 minutes each week. The exercise should increase your heart rate and make you sweat (moderate-intensity exercise).  Do strengthening exercises at least twice a week. This is in addition to the moderate-intensity exercise.  Spend less time sitting. Even light physical activity can be beneficial. Watch cholesterol and blood lipids Have your blood tested for lipids and cholesterol at 62 years of age, then have this test every 5 years. You may need to have your cholesterol levels checked more often if:  Your lipid or cholesterol levels are high.  You are older than 62 years of age.  You are at high risk for heart disease. What should I know about cancer screening? Many types of cancers can be detected early and may often be prevented. Depending on your health history and family history, you may need to have cancer screening at various ages. This may include screening for:  Colorectal cancer.  Prostate cancer.  Skin cancer.  Lung cancer. What should I know about heart disease, diabetes, and high blood pressure? Blood pressure and heart disease  High blood pressure causes heart disease and increases the risk of stroke. This is more likely to develop in people who have high blood pressure readings, are of African descent, or are overweight.  Talk with your health care provider about your target blood pressure readings.  Have your blood pressure checked: ? Every 3-5 years if you are 69-4 years of age. ? Every year if you are 41 years old or older.  If you are between the ages of 7 and 43 and are a current or former smoker, ask your health care provider if you should have a one-time screening for abdominal aortic aneurysm (AAA). Diabetes Have regular diabetes screenings. This checks your fasting blood sugar level. Have the screening done:  Once every three years after age 60 if you are at  a normal weight and have a low risk for diabetes.  More often and at a younger age if you are overweight or have a high risk for diabetes. What should I know about preventing infection? Hepatitis B If you have a higher risk for hepatitis B, you should be screened for this virus. Talk with your health care provider to find out if you are at risk for hepatitis B infection. Hepatitis C Blood testing is recommended for:  Everyone born from [birth year] through 1965.  Anyone with known risk factors for hepatitis C. Sexually transmitted infections (STIs)  You should be screened each year for STIs, including gonorrhea and chlamydia, if: ? You are sexually active and are younger than 62 years of age. ? You are older than 62 years of age and your health care provider tells you that you are at risk for this type of infection. ? Your sexual activity has changed since you were last screened,  and you are at increased risk for chlamydia or gonorrhea. Ask your health care provider if you are at risk.  Ask your health care provider about whether you are at high risk for HIV. Your health care provider may recommend a prescription medicine to help prevent HIV infection. If you choose to take medicine to prevent HIV, you should first get tested for HIV. You should then be tested every 3 months for as long as you are taking the medicine. Follow these instructions at home: Lifestyle  Do not use any products that contain nicotine or tobacco, such as cigarettes, e-cigarettes, and chewing tobacco. If you need help quitting, ask your health care provider.  Do not use street drugs.  Do not share needles.  Ask your health care provider for help if you need support or information about quitting drugs. Alcohol use  Do not drink alcohol if your health care provider tells you not to drink.  If you drink alcohol: ? Limit how much you have to 0-2 drinks a day. ? Be aware of how much alcohol is in your drink. In the [place]  one drink equals one 12 oz bottle of beer (355 mL), one 5 oz glass of wine (148 mL), or one 1 oz glass of hard liquor (44 mL). General instructions  Schedule regular health, dental, and eye exams.  Stay current with your vaccines.  Tell your health care provider if: ? You often feel depressed. ? You have ever been abused or do not feel safe at home. Summary  Adopting a healthy lifestyle and getting preventive care are important in promoting health and wellness.  Follow your health care provider's instructions about healthy diet, exercising, and getting tested or screened for diseases.  Follow your health care provider's instructions on monitoring your cholesterol and blood pressure. This information is not intended to replace advice given to you by your health care provider. Make sure you discuss any questions you have with your health care provider. Document Revised: 12/12/2017 Document Reviewed: 12/12/2017 Elsevier Patient Education  2020 [employer]       ElyWanda K. Emmelina Mcloughlin M.D.

## 2019-08-12 ENCOUNTER — Other Ambulatory Visit: Payer: Self-pay | Admitting: Internal Medicine

## 2019-08-12 ENCOUNTER — Ambulatory Visit (INDEPENDENT_AMBULATORY_CARE_PROVIDER_SITE_OTHER): Payer: BC Managed Care – PPO | Admitting: Internal Medicine

## 2019-08-12 ENCOUNTER — Other Ambulatory Visit: Payer: Self-pay

## 2019-08-12 ENCOUNTER — Encounter: Payer: Self-pay | Admitting: Internal Medicine

## 2019-08-12 VITALS — BP 110/76 | HR 52 | Temp 97.5°F | Ht 70.5 in | Wt 196.6 lb

## 2019-08-12 DIAGNOSIS — I10 Essential (primary) hypertension: Secondary | ICD-10-CM | POA: Diagnosis not present

## 2019-08-12 DIAGNOSIS — Z833 Family history of diabetes mellitus: Secondary | ICD-10-CM

## 2019-08-12 DIAGNOSIS — Z0000 Encounter for general adult medical examination without abnormal findings: Secondary | ICD-10-CM

## 2019-08-12 DIAGNOSIS — Z79899 Other long term (current) drug therapy: Secondary | ICD-10-CM

## 2019-08-12 DIAGNOSIS — R0989 Other specified symptoms and signs involving the circulatory and respiratory systems: Secondary | ICD-10-CM

## 2019-08-12 DIAGNOSIS — Z125 Encounter for screening for malignant neoplasm of prostate: Secondary | ICD-10-CM

## 2019-08-12 DIAGNOSIS — E785 Hyperlipidemia, unspecified: Secondary | ICD-10-CM

## 2019-08-12 NOTE — Patient Instructions (Signed)
Will notify you  of labs when available.  Will arrange a screening abd aorta ultrasound as  Follow up Consideration  of  Coronary calcium score for risk assessment  For statin intervention .    Health Maintenance, Male Adopting a healthy lifestyle and getting preventive care are important in promoting health and wellness. Ask your health care provider about:  The right schedule for you to have regular tests and exams.  Things you can do on your own to prevent diseases and keep yourself healthy. What should I know about diet, weight, and exercise? Eat a healthy diet   Eat a diet that includes plenty of vegetables, fruits, low-fat dairy products, and lean protein.  Do not eat a lot of foods that are high in solid fats, added sugars, or sodium. Maintain a healthy weight Body mass index (BMI) is a measurement that can be used to identify possible weight problems. It estimates body fat based on height and weight. Your health care provider can help determine your BMI and help you achieve or maintain a healthy weight. Get regular exercise Get regular exercise. This is one of the most important things you can do for your health. Most adults should:  Exercise for at least 150 minutes each week. The exercise should increase your heart rate and make you sweat (moderate-intensity exercise).  Do strengthening exercises at least twice a week. This is in addition to the moderate-intensity exercise.  Spend less time sitting. Even light physical activity can be beneficial. Watch cholesterol and blood lipids Have your blood tested for lipids and cholesterol at 62 years of age, then have this test every 5 years. You may need to have your cholesterol levels checked more often if:  Your lipid or cholesterol levels are high.  You are older than 62 years of age.  You are at high risk for heart disease. What should I know about cancer screening? Many types of cancers can be detected early and may  often be prevented. Depending on your health history and family history, you may need to have cancer screening at various ages. This may include screening for:  Colorectal cancer.  Prostate cancer.  Skin cancer.  Lung cancer. What should I know about heart disease, diabetes, and high blood pressure? Blood pressure and heart disease  High blood pressure causes heart disease and increases the risk of stroke. This is more likely to develop in people who have high blood pressure readings, are of African descent, or are overweight.  Talk with your health care provider about your target blood pressure readings.  Have your blood pressure checked: ? Every 3-5 years if you are 57-47 years of age. ? Every year if you are 34 years old or older.  If you are between the ages of 55 and 71 and are a current or former smoker, ask your health care provider if you should have a one-time screening for abdominal aortic aneurysm (AAA). Diabetes Have regular diabetes screenings. This checks your fasting blood sugar level. Have the screening done:  Once every three years after age 27 if you are at a normal weight and have a low risk for diabetes.  More often and at a younger age if you are overweight or have a high risk for diabetes. What should I know about preventing infection? Hepatitis B If you have a higher risk for hepatitis B, you should be screened for this virus. Talk with your health care provider to find out if you are at risk  for hepatitis B infection. Hepatitis C Blood testing is recommended for:  Everyone born from [birth year] through 1963.  Anyone with known risk factors for hepatitis C. Sexually transmitted infections (STIs)  You should be screened each year for STIs, including gonorrhea and chlamydia, if: ? You are sexually active and are younger than 62 years of age. ? You are older than 62 years of age and your health care provider tells you that you are at risk for this type of  infection. ? Your sexual activity has changed since you were last screened, and you are at increased risk for chlamydia or gonorrhea. Ask your health care provider if you are at risk.  Ask your health care provider about whether you are at high risk for HIV. Your health care provider may recommend a prescription medicine to help prevent HIV infection. If you choose to take medicine to prevent HIV, you should first get tested for HIV. You should then be tested every 3 months for as long as you are taking the medicine. Follow these instructions at home: Lifestyle  Do not use any products that contain nicotine or tobacco, such as cigarettes, e-cigarettes, and chewing tobacco. If you need help quitting, ask your health care provider.  Do not use street drugs.  Do not share needles.  Ask your health care provider for help if you need support or information about quitting drugs. Alcohol use  Do not drink alcohol if your health care provider tells you not to drink.  If you drink alcohol: ? Limit how much you have to 0-2 drinks a day. ? Be aware of how much alcohol is in your drink. In the [place] one drink equals one 12 oz bottle of beer (355 mL), one 5 oz glass of wine (148 mL), or one 1 oz glass of hard liquor (44 mL). General instructions  Schedule regular health, dental, and eye exams.  Stay current with your vaccines.  Tell your health care provider if: ? You often feel depressed. ? You have ever been abused or do not feel safe at home. Summary  Adopting a healthy lifestyle and getting preventive care are important in promoting health and wellness.  Follow your health care provider's instructions about healthy diet, exercising, and getting tested or screened for diseases.  Follow your health care provider's instructions on monitoring your cholesterol and blood pressure. This information is not intended to replace advice given to you by your health care provider. Make sure you  discuss any questions you have with your health care provider. Document Revised: 12/12/2017 Document Reviewed: 12/12/2017 Elsevier Patient Education  2020 [employer].

## 2019-08-13 LAB — CBC WITH DIFFERENTIAL/PLATELET
Absolute Monocytes: 603 cells/uL (ref 200–950)
Basophils Absolute: 32 cells/uL (ref 0–200)
Basophils Relative: 0.7 %
Eosinophils Absolute: 110 cells/uL (ref 15–500)
Eosinophils Relative: 2.4 %
HCT: 47.3 % (ref 38.5–50.0)
Hemoglobin: 15.1 g/dL (ref 13.2–17.1)
Lymphs Abs: 1684 cells/uL (ref 850–3900)
MCH: 25.3 pg — ABNORMAL LOW (ref 27.0–33.0)
MCHC: 31.9 g/dL — ABNORMAL LOW (ref 32.0–36.0)
MCV: 79.4 fL — ABNORMAL LOW (ref 80.0–100.0)
MPV: 10.8 fL (ref 7.5–12.5)
Monocytes Relative: 13.1 %
Neutro Abs: 2171 cells/uL (ref 1500–7800)
Neutrophils Relative %: 47.2 %
Platelets: 148 10*3/uL (ref 140–400)
RBC: 5.96 10*6/uL — ABNORMAL HIGH (ref 4.20–5.80)
RDW: 15.1 % — ABNORMAL HIGH (ref 11.0–15.0)
Total Lymphocyte: 36.6 %
WBC: 4.6 10*3/uL (ref 3.8–10.8)

## 2019-08-13 LAB — HEPATIC FUNCTION PANEL
AG Ratio: 1.6 (calc) (ref 1.0–2.5)
ALT: 16 U/L (ref 9–46)
AST: 17 U/L (ref 10–35)
Albumin: 4 g/dL (ref 3.6–5.1)
Alkaline phosphatase (APISO): 68 U/L (ref 35–144)
Bilirubin, Direct: 0.1 mg/dL (ref 0.0–0.2)
Globulin: 2.5 g/dL (calc) (ref 1.9–3.7)
Indirect Bilirubin: 0.4 mg/dL (calc) (ref 0.2–1.2)
Total Bilirubin: 0.5 mg/dL (ref 0.2–1.2)
Total Protein: 6.5 g/dL (ref 6.1–8.1)

## 2019-08-13 LAB — HEMOGLOBIN A1C
Hgb A1c MFr Bld: 5.6 % of total Hgb (ref <5.7)
Mean Plasma Glucose: 114 (calc)
eAG (mmol/L): 6.3 (calc)

## 2019-08-13 LAB — BASIC METABOLIC PANEL
BUN: 23 mg/dL (ref 7–25)
CO2: 29 mmol/L (ref 20–32)
Calcium: 8.9 mg/dL (ref 8.6–10.3)
Chloride: 101 mmol/L (ref 98–110)
Creat: 1.01 mg/dL (ref 0.70–1.25)
Glucose, Bld: 90 mg/dL (ref 65–99)
Potassium: 4.6 mmol/L (ref 3.5–5.3)
Sodium: 137 mmol/L (ref 135–146)

## 2019-08-13 LAB — LIPID PANEL
Cholesterol: 143 mg/dL (ref <200)
HDL: 50 mg/dL (ref >=40)
LDL Cholesterol (Calc): 79 mg/dL (calc)
Non-HDL Cholesterol (Calc): 93 mg/dL (calc) (ref <130)
Total CHOL/HDL Ratio: 2.9 (calc) (ref <5.0)
Triglycerides: 67 mg/dL (ref <150)

## 2019-08-13 LAB — PSA: PSA: 0.9 ng/mL (ref <=4.0)

## 2019-08-13 LAB — TSH: TSH: 1.53 mIU/L (ref 0.40–4.50)

## 2019-08-15 NOTE — Telephone Encounter (Signed)
Yes ssir  I ordered this test and a different test The aorta US is to make sure no  Aortic aneurysm based on last  [place] from 5 years ago   Calcium score is different :  To assess plaque burden in arteries  In chest . If elevated consider statin therapy . Let us know if you want to proceed with this separate test.   IMPRESSION:  No evidence of abdominal aortic aneurysm. Mild prominence of the  proximal abdominal aorta measuring 2.5 cm. Recommend follow-up  ultrasound in 5 years.   This recommendation follows ACR consensus guidelines: White Paper of  the ACR Incidental Findings Committee II on Vascular Findings. J Am  Coll Radiol [phone].    Electronically Signed  By: Elberta Fortis M.D.  On: 06/07/2013 14:02

## 2019-08-15 NOTE — Progress Notes (Signed)
Results stable ( cbc)  or normal and  lipid panel looks favorable  Do you want [place] to proceed with coronary artery calcium score for riks assessment   If so  megan please order  dx CV risk assessment  family hx of CV disease

## 2019-08-18 ENCOUNTER — Other Ambulatory Visit: Payer: Self-pay

## 2019-08-18 DIAGNOSIS — Z8249 Family history of ischemic heart disease and other diseases of the circulatory system: Secondary | ICD-10-CM

## 2019-08-18 DIAGNOSIS — Z139 Encounter for screening, unspecified: Secondary | ICD-10-CM

## 2019-08-20 ENCOUNTER — Ambulatory Visit
Admission: RE | Admit: 2019-08-20 | Discharge: 2019-08-20 | Disposition: A | Discharge status: Home / Self Care | Payer: BC Managed Care – PPO | Source: Ambulatory Visit | Attending: Internal Medicine | Admitting: Internal Medicine

## 2019-08-20 DIAGNOSIS — R0989 Other specified symptoms and signs involving the circulatory and respiratory systems: Secondary | ICD-10-CM

## 2019-08-20 DIAGNOSIS — I714 Abdominal aortic aneurysm, without rupture: Secondary | ICD-10-CM | POA: Diagnosis not present

## 2019-08-20 DIAGNOSIS — E119 Type 2 diabetes mellitus without complications: Secondary | ICD-10-CM | POA: Diagnosis not present

## 2019-08-21 ENCOUNTER — Other Ambulatory Visit: Payer: Self-pay

## 2019-08-21 DIAGNOSIS — I714 Abdominal aortic aneurysm, without rupture, unspecified: Secondary | ICD-10-CM

## 2019-08-21 NOTE — Progress Notes (Signed)
So the updated  [place]  Megan Please do a vascular referral  .for abd aneurysm dx   No to you danger at this time.

## 2019-08-22 ENCOUNTER — Other Ambulatory Visit: Payer: Self-pay | Admitting: Internal Medicine

## 2019-08-22 DIAGNOSIS — E785 Hyperlipidemia, unspecified: Secondary | ICD-10-CM

## 2019-08-22 DIAGNOSIS — Z8249 Family history of ischemic heart disease and other diseases of the circulatory system: Secondary | ICD-10-CM

## 2019-09-01 ENCOUNTER — Other Ambulatory Visit: Payer: Self-pay

## 2019-09-01 ENCOUNTER — Ambulatory Visit (INDEPENDENT_AMBULATORY_CARE_PROVIDER_SITE_OTHER)
Admission: RE | Admit: 2019-09-01 | Discharge: 2019-09-01 | Disposition: A | Discharge status: Home / Self Care | Payer: Self-pay | Source: Ambulatory Visit | Attending: Internal Medicine | Admitting: Internal Medicine

## 2019-09-01 DIAGNOSIS — Z8249 Family history of ischemic heart disease and other diseases of the circulatory system: Secondary | ICD-10-CM

## 2019-09-01 DIAGNOSIS — E785 Hyperlipidemia, unspecified: Secondary | ICD-10-CM

## 2019-09-01 NOTE — Progress Notes (Signed)
So coronary calcium show medium risk   with some plaque build up   You may want to begin  statin medication  let me know and we can do a virtual visit to discuss  If  you want to discuss with cardiology you can also .   The small pulmonary nodules are  incidental findings and most likely  benign  see interpretation as well as tiny cyst in part of seen liver

## 2019-09-02 ENCOUNTER — Telehealth (INDEPENDENT_AMBULATORY_CARE_PROVIDER_SITE_OTHER): Payer: BC Managed Care – PPO | Admitting: Internal Medicine

## 2019-09-02 ENCOUNTER — Encounter: Payer: Self-pay | Admitting: Internal Medicine

## 2019-09-02 VITALS — Ht 70.5 in | Wt 188.0 lb

## 2019-09-02 DIAGNOSIS — Z79899 Other long term (current) drug therapy: Secondary | ICD-10-CM

## 2019-09-02 DIAGNOSIS — Z8249 Family history of ischemic heart disease and other diseases of the circulatory system: Secondary | ICD-10-CM | POA: Diagnosis not present

## 2019-09-02 DIAGNOSIS — R931 Abnormal findings on diagnostic imaging of heart and coronary circulation: Secondary | ICD-10-CM

## 2019-09-02 MED ORDER — ATORVASTATIN CALCIUM 20 MG PO TABS: 20.0000 mg | ORAL_TABLET | Freq: Every day | ORAL | 1 refills | Status: DC

## 2019-09-02 NOTE — Progress Notes (Signed)
   Virtual Visit via Telephone Note  I connected with@ on 09/02/19 at 10:30 AM EDT by telephone and verified that I am speaking with the correct person using two identifiers.   I discussed the limitations, risks, security and privacy concerns of performing an evaluation and management service by telephone and the limited availability of in person appointments. tThere may be a patient responsible charge related to this service. The patient expressed understanding and agreed to proceed.  Location patient: work   Catering manager: work  office Participants present for the call: patient, provider Patient did not have a visit in the prior 7 days to address this/these issue(s).   History of Present Illness: Jake Delgado  Presents for disc of scan results cc score at  58th percentile  And noted scattered calcification   Still doing well   Sx wise see my chart messages and  Scan results    Observations/Objective: Patient sounds personable and well on the phone. I do not appreciate any SOB. Speech and thought processing are grossly intact. Patient reported vitals: Lab Results  Component Value Date   WBC 4.6 08/12/2019   HGB 15.1 08/12/2019   HCT 47.3 08/12/2019   PLT 148 08/12/2019   GLUCOSE 90 08/12/2019   CHOL 143 08/12/2019   TRIG 67 08/12/2019   HDL 50 08/12/2019   LDLCALC 79 08/12/2019   ALT 16 08/12/2019   AST 17 08/12/2019   NA 137 08/12/2019   K 4.6 08/12/2019   CL 101 08/12/2019   CREATININE 1.01 08/12/2019   BUN 23 08/12/2019   CO2 29 08/12/2019   TSH 1.53 08/12/2019   PSA 0.9 08/12/2019   HGBA1C 5.6 08/12/2019   Coronary calcium score of 62.2. This was 58th percentile for age and sex matched control.  Scattered calcification noted in all three coronary distributions. Assessment and Plan: Elevated coronary artery calcium score - Plan: Lipid panel, Hepatic function panel  Medication management - Plan: Lipid panel, Hepatic function panel  Family history of  heart disease - Plan: Lipid panel, Hepatic function panel   disc preventive strategies to continue and add statin med  benefit more than risk at this time  ldl has been good  Butw  Risk assessment     Plan add statin med as tolerated .  Follow Up Instructions:  Plan lab fu lipid lfts in about 3 months and go from there as tolerated   If tolerates can consider in dose to 40 mg     [id number] 5-10 [id number] 11-20 [id number] 21-30 I did not refer this patient for an OV in the next 24 hours for this/these issue(s).  I discussed the assessment and treatment plan with the patient. The patient was provided an opportunity to ask questions and answered. The patient agreed with the plan and demonstrated an understanding of the instructions.   The patient was advised to call back or seek an in-person evaluation if the symptoms worsen or if the condition fails to improve as anticipated.  I provided of non-face-to-face time during this encounter.and plans  Return in about 3 months (around 12/02/2019) for lab ,orders in system.  Berniece Andreas, MD

## 2019-09-04 DIAGNOSIS — M25561 Pain in right knee: Secondary | ICD-10-CM | POA: Diagnosis not present

## 2019-09-04 DIAGNOSIS — M25562 Pain in left knee: Secondary | ICD-10-CM | POA: Diagnosis not present

## 2019-10-07 ENCOUNTER — Encounter: Payer: Self-pay | Admitting: Vascular Surgery

## 2019-10-07 ENCOUNTER — Ambulatory Visit (INDEPENDENT_AMBULATORY_CARE_PROVIDER_SITE_OTHER): Payer: BC Managed Care – PPO | Admitting: Vascular Surgery

## 2019-10-07 ENCOUNTER — Other Ambulatory Visit: Payer: Self-pay

## 2019-10-07 DIAGNOSIS — I714 Abdominal aortic aneurysm, without rupture, unspecified: Secondary | ICD-10-CM | POA: Insufficient documentation

## 2019-10-07 NOTE — Progress Notes (Signed)
Patient name: Jake Delgado MRN: [id number] DOB: 05/09/1955 Sex: male  REASON FOR CONSULT: 4 cm abdominal aortic aneurysm  HPI: Jake Delgado is a 64 y.o. male, with history of hypertension and obstructive sleep apnea who presents for evaluation of a 4 cm abdominal aortic aneurysm.  Patient states he initially knew about his aneurysm in 2015 when he had an ultrasound.  Ultimately he just had another surveillance scan on 08/20/2019 that showed a 4 cm proximal abdominal aortic aneurysm.  He denies any new abdominal or back pain.  He denies any tobacco abuse.  No family history of aneurysm disease.  He works in Production manager.  Past Medical History:  Diagnosis Date  . Allergy    seasonal  . H/O left knee surgery    MCL 2008 Dr. Despina Hick  . History of exercise stress test 2015   exercise cardiopulmonary  test  . History of varicella   . Hypertension   . Sleep apnea    was tested-no issues found.  . Vitiligo    when younger no treatment doesn't run in families.    Past Surgical History:  Procedure Laterality Date  . CARDIOVASCULAR STRESS TEST    . COLONOSCOPY  2011  . dental implants    . KNEE ARTHROSCOPY      Family History  Problem Relation Age of Onset  . Hypertension Mother   . Diabetes Mellitus II Mother   . Heart disease Other        maternal uncle in his 83s  . Colon cancer Neg Hx   . Colon polyps Neg Hx   . Esophageal cancer Neg Hx   . Stomach cancer Neg Hx   . Rectal cancer Neg Hx     SOCIAL HISTORY: Social History   Socioeconomic History  . Marital status: Married    Spouse name: Not on file  . Number of children: Not on file  . Years of education: Not on file  . Highest education level: Not on file  Occupational History  . Occupation: Surveyor, minerals  Tobacco Use  . Smoking status: Never Smoker  . Smokeless tobacco: Never Used  Vaping Use  . Vaping Use: Never used  Substance and Sexual Activity  . Alcohol use: No  . Drug use: No  . Sexual  activity: Not on file  Other Topics Concern  . 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   Give ETS FA   Vegetarian but  Pos dairly and sometimes meat   Social Determinants of Health   Financial Resource Strain:   . Difficulty of Paying Living Expenses: Not on file  Food Insecurity:   . Worried About Programme researcher, broadcasting/film/video in the Last Year: Not on file  . Ran Out of Food in the Last Year: Not on file  Transportation Needs:   . Lack of Transportation (Medical): Not on file  . Lack of Transportation (Non-Medical): Not on file  Physical Activity:   . Days of Exercise per Week: Not on file  . Minutes of Exercise per Session: Not on file  Stress:   . Feeling of Stress : Not on file  Social Connections:   .  Frequency of Communication with Friends and Family: Not on file  . Frequency of Social Gatherings with Friends and Family: Not on file  . Attends Religious Services: Not on file  . Active Member of Clubs or Organizations: Not on file  . Attends Banker Meetings: Not on file  . Marital Status: Not on file  Intimate Partner Violence:   . Fear of Current or Ex-Partner: Not on file  . Emotionally Abused: Not on file  . Physically Abused: Not on file  . Sexually Abused: Not on file    Allergies  Allergen Reactions  . Ansaid [Flurbiprofen] Other (See Comments)    Patients wife stated that he can not take any Ansaids  . Dust Mite Extract   . Ibuprofen     REACTION: swelling neck and difficulty breathing    Current Outpatient Medications  Medication Sig Dispense Refill  . atorvastatin (LIPITOR) 20 MG tablet Take 1 tablet (20 mg total) by mouth daily. 90 tablet 1  . losartan (COZAAR) 50 MG tablet Take 1 tablet  (50 mg total) by mouth daily. 90 tablet 3  . cetirizine (ZYRTEC) 10 MG tablet Take 10 mg by mouth daily. (Patient not taking: Reported on 08/12/2019)     No current facility-administered medications for this visit.    REVIEW OF SYSTEMS:  [X]  denotes positive finding, [ ]  denotes negative finding Cardiac  Comments:  Chest pain or chest pressure:    Shortness of breath upon exertion:    Short of breath when lying flat:    Irregular heart rhythm:        Vascular    Pain in calf, thigh, or hip brought on by ambulation:    Pain in feet at night that wakes you up from your sleep:     Blood clot in your veins:    Leg swelling:         Pulmonary    Oxygen at home:    Productive cough:     Wheezing:         Neurologic    Sudden weakness in arms or legs:     Sudden numbness in arms or legs:     Sudden onset of difficulty speaking or slurred speech:    Temporary loss of vision in one eye:     Problems with dizziness:         Gastrointestinal    Blood in stool:     Vomited blood:         Genitourinary    Burning when urinating:     Blood in urine:        Psychiatric    Major depression:         Hematologic    Bleeding problems:    Problems with blood clotting too easily:        Skin    Rashes or ulcers:        Constitutional    Fever or chills:      PHYSICAL EXAM: Vitals:   10/07/19 0908  BP: 118/78  Pulse: (!) 55  Resp: 18  Temp: 98.3 F (36.8 C)  TempSrc: Temporal  SpO2: 94%  Weight: 197 lb (89.4 kg)  Height: 5\' 10"  (1.778 m)    GENERAL: The patient is a well-nourished male, in no acute distress. The vital signs are documented above. CARDIAC: There is a regular rate and rhythm.  VASCULAR:  2+ femoral pulses bilaterally 2+ dorsalis pedis and posterior tibial pulses bilaterally PULMONARY: There is  good air exchange bilaterally without wheezing or rales. ABDOMEN: Soft and non-tender.  No significant pain with palpation of aneurysm. MUSCULOSKELETAL: There  are no major deformities or cyanosis. NEUROLOGIC: No focal weakness or paresthesias are detected. SKIN: There are no ulcers or rashes noted. PSYCHIATRIC: The patient has a normal affect.  DATA:   Ultrasound aorta 08/20/2019 shows 4 cm proximal abdominal aortic aneurysm.  Assessment/Plan:  64 year old male presents for evaluation of 4 cm proximal abdominal aortic aneurysm.  Ultimately I reviewed aneurysm disease with the patient.  Discussed that current guidelines are to repair infrarenal aortic aneurysms at greater than 5.5 cm in men unless there is rapid growth in the aneurysm or he is having symptoms.  Given he remains asymptomatic with a 4 cm AAA, no indication for surgical repair at this time.  I recommend follow-up again in 1 year with ultrasound here in our office.  He knows to call with questions or concerns.   Cephus Shelling, MD [facility] Office: [phone]

## 2019-10-10 DIAGNOSIS — Z23 Encounter for immunization: Secondary | ICD-10-CM | POA: Diagnosis not present

## 2020-02-29 ENCOUNTER — Other Ambulatory Visit: Payer: Self-pay | Admitting: Internal Medicine

## 2020-03-05 ENCOUNTER — Other Ambulatory Visit: Payer: Self-pay

## 2020-03-05 MED ORDER — LOSARTAN POTASSIUM 50 MG PO TABS: 50.0000 mg | ORAL_TABLET | Freq: Every day | ORAL | 1 refills | Status: DC

## 2020-04-12 ENCOUNTER — Encounter: Payer: Self-pay | Admitting: Internal Medicine

## 2020-05-19 DIAGNOSIS — L738 Other specified follicular disorders: Secondary | ICD-10-CM | POA: Diagnosis not present

## 2020-05-19 DIAGNOSIS — D2261 Melanocytic nevi of right upper limb, including shoulder: Secondary | ICD-10-CM | POA: Diagnosis not present

## 2020-05-19 DIAGNOSIS — L918 Other hypertrophic disorders of the skin: Secondary | ICD-10-CM | POA: Diagnosis not present

## 2020-05-19 DIAGNOSIS — L80 Vitiligo: Secondary | ICD-10-CM | POA: Diagnosis not present

## 2020-05-26 DIAGNOSIS — Z79899 Other long term (current) drug therapy: Secondary | ICD-10-CM | POA: Diagnosis not present

## 2020-05-26 DIAGNOSIS — Z130 Encounter for screening for diseases of the blood and blood-forming organs and certain disorders involving the immune mechanism: Secondary | ICD-10-CM | POA: Diagnosis not present

## 2020-05-26 DIAGNOSIS — R7309 Other abnormal glucose: Secondary | ICD-10-CM | POA: Diagnosis not present

## 2020-05-26 DIAGNOSIS — E785 Hyperlipidemia, unspecified: Secondary | ICD-10-CM | POA: Diagnosis not present

## 2020-08-13 ENCOUNTER — Encounter: Payer: BC Managed Care – PPO | Admitting: Internal Medicine

## 2020-08-22 NOTE — Progress Notes (Signed)
Chief Complaint  Patient presents with   Annual Exam     HPI: Patient  Jake Delgado  65 y.o. comes in today for Preventive Health Care visit  He has been doing well no major changes in his health sees Dr. Cleon Gustin yearly for high risk family history of diabetes request to make sure we do an A1c today. HLD see coronary artery calcium score is on atorvastatin. Sees Dr. [name] yearly dermatology no changes Followed by vascular for large aortic with family history of AAA. Noted to swelling when he looked at a picture with hiking left upper quadrant no pain only when standing. Blood pressure seems to be controlled.    Health Maintenance  Topic Date Due   Pneumococcal Vaccine 78-61 Years old (1 - PCV) Never done   COVID-19 Vaccine (5 - Booster) 08/09/2020   INFLUENZA VACCINE  08/02/2020   TETANUS/TDAP  01/14/2024   COLONOSCOPY (Pts 45-41yrs Insurance coverage will need to be confirmed)  06/10/2029   Hepatitis C Screening  Completed   HIV Screening  Completed   Zoster Vaccines- Shingrix  Completed   HPV VACCINES  Aged Out   Health Maintenance Review LIFESTYLE:  Exercise:   active  Tobacco/ETS: no Alcohol:   no Sugar beverages:  no Sleep: about 7  Drug use: no HH of   2   no pets  Work:busy no changes . Too much   some travel and other     ROS: as per hpi nocturia x 1 no changes   Past Medical History:  Diagnosis Date   Allergy    seasonal   H/O left knee surgery    MCL 2008 Dr. Despina Hick   History of exercise stress test 2015   exercise cardiopulmonary  test   History of varicella    Hypertension    Sleep apnea    was tested-no issues found.   Vitiligo    when younger no treatment doesn't run in families.    Past Surgical History:  Procedure Laterality Date   CARDIOVASCULAR STRESS TEST     COLONOSCOPY  2011   dental implants     KNEE ARTHROSCOPY      Family History  Problem Relation Age of Onset   Hypertension Mother    Diabetes Mellitus II Mother     Heart disease Other        maternal uncle in his 31s   Colon cancer Neg Hx    Colon polyps Neg Hx    Esophageal cancer Neg Hx    Stomach cancer Neg Hx    Rectal cancer Neg Hx     Social History   Socioeconomic History   Marital status: Married    Spouse name: Not on file   Number of children: Not on file   Years of education: Not on file   Highest education level: Not on file  Occupational History   Occupation: Surveyor, minerals  Tobacco Use   Smoking status: Never   Smokeless tobacco: Never  Vaping Use   Vaping Use: Never used  Substance and Sexual Activity   Alcohol use: No   Drug use: No   Sexual activity: Not on file  Other Topics Concern   Not on file  Social History Narrative   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   Give ETS FA   Vegetarian but  Pos dairly and sometimes meat   Social Determinants of Health   Financial Resource Strain: Not on file  Food Insecurity: Not on file  Transportation Needs: Not on file  Physical Activity: Not on file  Stress: Not on file  Social Connections: Not on file    Outpatient Medications Prior to Visit  Medication Sig Dispense Refill   atorvastatin (LIPITOR) 20 MG tablet TAKE 1 TABLET BY MOUTH EVERY DAY 90 tablet 1   levocetirizine (XYZAL) 5 MG tablet Take 5 mg by mouth every evening.     losartan (COZAAR) 50 MG tablet Take 1 tablet (50 mg total) by mouth daily. 90 tablet 1   labetalol (NORMODYNE) 100 MG tablet Take 100 mg by mouth 2 (two) times daily.     cetirizine (ZYRTEC) 10 MG tablet Take 10 mg by mouth daily. (Patient not taking: Reported on 08/12/2019)     No facility-administered medications prior to visit.     EXAM:  BP 110/70 (BP Location: Left Arm, Patient Position: Sitting, Cuff Size:  Normal)   Pulse (!) 51   Temp 98.6 F (37 C) (Oral)   Ht 5' 10.5" (1.791 m)   Wt 196 lb 3.2 oz (89 kg)   SpO2 96%   BMI 27.75 kg/m   Body mass index is 27.75 kg/m. Wt Readings from Last 3 Encounters:  08/23/20 196 lb 3.2 oz (89 kg)  10/07/19 197 lb (89.4 kg)  09/02/19 188 lb (85.3 kg)    Physical Exam: Vital signs reviewed ZOX:WRUEGEN:This is a well-developed well-nourished alert cooperative    who appearsr stated age in no acute distress.  HEENT: normocephalic atraumatic , Eyes: PERRL EOM's full, conjunctiva clear,tenderness., Ears: no deformity EAC's clear TMs with normal landmarks. Mouth: masked  NECK: supple without masses, thyromegaly or bruits. CHEST/PULM:  Clear to auscultation and percussion breath sounds equal no wheeze , rales or rhonchi. No chest wall deformities or tenderness. . CV: PMI is nondisplaced, S1 S2 no gallops, murmurs, rubs. Peripheral pulses are full without delay.No JVD .  ABDOMEN: Bowel sounds normal nontender  No guard or rebound, no hepato splenomegal no CVA tenderness.  When standing  ? Mild bulge luq non tender no mass  Extremtities:  No clubbing cyanosis or edema, no acute joint swelling or redness no focal atrophy NEURO:  Oriented x3, cranial nerves 3-12 appear to be intact, no obvious focal weakness,gait within normal limits no abnormal reflexes or asymmetrical SKIN: No acute rashes normal turgor,  no bruising or petechiae. Vitiligous  skin  PSYCH: Oriented, good eye contact, no obvious depression anxiety, cognition and judgment appear normal. LN: no cervical axillary inguinal adenopathy  Lab Results  Component Value Date   WBC 4.6 08/12/2019   HGB 15.1 08/12/2019   HCT 47.3 08/12/2019   PLT 148 08/12/2019   GLUCOSE 90 08/12/2019   CHOL 143 08/12/2019   TRIG 67 08/12/2019   HDL 50 08/12/2019   LDLCALC 79 08/12/2019   ALT 16 08/12/2019   AST 17 08/12/2019   NA 137 08/12/2019   K 4.6 08/12/2019   CL 101 08/12/2019   CREATININE 1.01 08/12/2019    BUN 23 08/12/2019   CO2 29 08/12/2019   TSH 1.53 08/12/2019   PSA 0.9 08/12/2019   HGBA1C 5.6 08/12/2019    BP Readings from Last 3 Encounters:  08/23/20 110/70  10/07/19  118/78  08/12/19 110/76   Lab Results  Component Value Date   VITAMINB12 384 01/06/2014    Lab plan reviewed with patient   ASSESSMENT AND PLAN:  Discussed the following assessment and plan:    ICD-10-CM   1. Visit for preventive health examination  Z00.00 Basic metabolic panel    CBC with Differential/Platelet    Hemoglobin A1c    Hepatic function panel    Lipid panel    TSH    Vitamin B12    Vitamin B12    TSH    Lipid panel    Hepatic function panel    Hemoglobin A1c    CBC with Differential/Platelet    Basic metabolic panel    2. Medication management  Z79.899 Basic metabolic panel    CBC with Differential/Platelet    Hemoglobin A1c    Hepatic function panel    Lipid panel    TSH    Vitamin B12    Vitamin B12    TSH    Lipid panel    Hepatic function panel    Hemoglobin A1c    CBC with Differential/Platelet    Basic metabolic panel    3. Hyperlipidemia, unspecified hyperlipidemia type  E78.5 Basic metabolic panel    CBC with Differential/Platelet    Hemoglobin A1c    Hepatic function panel    Lipid panel    TSH    Vitamin B12    Vitamin B12    TSH    Lipid panel    Hepatic function panel    Hemoglobin A1c    CBC with Differential/Platelet    Basic metabolic panel    4. Essential hypertension  I10 Basic metabolic panel    CBC with Differential/Platelet    Hemoglobin A1c    Hepatic function panel    Lipid panel    TSH    Vitamin B12    Vitamin B12    TSH    Lipid panel    Hepatic function panel    Hemoglobin A1c    CBC with Differential/Platelet    Basic metabolic panel    5. Screening PSA (prostate specific antigen)  Z12.5 PSA    PSA    6. Family history of diabetes mellitus  Z83.3 Basic metabolic panel    CBC with Differential/Platelet    Hemoglobin A1c     Hepatic function panel    Lipid panel    TSH    Vitamin B12    Vitamin B12    TSH    Lipid panel    Hepatic function panel    Hemoglobin A1c    CBC with Differential/Platelet    Basic metabolic panel    7. Vitiligo  L80 Basic metabolic panel    CBC with Differential/Platelet    Hemoglobin A1c    Hepatic function panel    Lipid panel    TSH    Vitamin B12    Vitamin B12    TSH    Lipid panel    Hepatic function panel    Hemoglobin A1c    CBC with Differential/Platelet    Basic metabolic panel    Appears to be in a healthy state monitoring lipids blood pressure chemistries risk of diabetes. Update B12 level Continue lifestyle intervention healthy eating and exercise .  Yearly check  lab pending  Return in about 1 year (around 08/23/2021) for depending on results.  Patient Care Team: Germaine Ripp, Neta Mends, MD as PCP - General (Internal Medicine) Sherene Sires,  Charlaine Dalton, MD as Consulting Physician (Pulmonary Disease) Dorisann Frames, MD as Consulting Physician (Endocrinology) [place], Amy, MD as Consulting Physician (Dermatology) Patient Instructions  Good to see  you today .  The swelling area may be a mild  abd wall hernia  .   At this time  observe if getting pain increasing swelling  can reevaluate.   Continue lifestyle intervention healthy eating and exercise .   If all ok then  yearly cpx and labs .  Health Maintenance, Male Adopting a healthy lifestyle and getting preventive care are important in promoting health and wellness. Ask your health care provider about: The right schedule for you to have regular tests and exams. Things you can do on your own to prevent diseases and keep yourself healthy. What should I know about diet, weight, and exercise? Eat a healthy diet  Eat a diet that includes plenty of vegetables, fruits, low-fat dairy products, and lean protein. Do not eat a lot of foods that are high in solid fats, added sugars, or sodium.  Maintain a healthy  weight Body mass index (BMI) is a measurement that can be used to identify possible weight problems. It estimates body fat based on height and weight. Your health care provider can help determine your BMI and help you achieve or maintain ahealthy weight. Get regular exercise Get regular exercise. This is one of the most important things you can do for your health. Most adults should: Exercise for at least 150 minutes each week. The exercise should increase your heart rate and make you sweat (moderate-intensity exercise). Do strengthening exercises at least twice a week. This is in addition to the moderate-intensity exercise. Spend less time sitting. Even light physical activity can be beneficial. Watch cholesterol and blood lipids Have your blood tested for lipids and cholesterol at 63 years of age, then havethis test every 5 years. You may need to have your cholesterol levels checked more often if: Your lipid or cholesterol levels are high. You are older than 63 years of age. You are at high risk for heart disease. What should I know about cancer screening? Many types of cancers can be detected early and may often be prevented. Depending on your health history and family history, you may need to have cancer screening at various ages. This may include screening for: Colorectal cancer. Prostate cancer. Skin cancer. Lung cancer. What should I know about heart disease, diabetes, and high blood pressure? Blood pressure and heart disease High blood pressure causes heart disease and increases the risk of stroke. This is more likely to develop in people who have high blood pressure readings, are of African descent, or are overweight. Talk with your health care provider about your target blood pressure readings. Have your blood pressure checked: Every 3-5 years if you are 32-70 years of age. Every year if you are 8 years old or older. If you are between the ages of 83 and 6 and are a current or  former smoker, ask your health care provider if you should have a one-time screening for abdominal aortic aneurysm (AAA). Diabetes Have regular diabetes screenings. This checks your fasting blood sugar level. Have the screening done: Once every three years after age 66 if you are at a normal weight and have a low risk for diabetes. More often and at a younger age if you are overweight or have a high risk for diabetes. What should I know about preventing infection? Hepatitis B If you have a higher risk  for hepatitis B, you should be screened for this virus. Talk with your health care provider to find out if you are at risk forhepatitis B infection. Hepatitis C Blood testing is recommended for: Everyone born from [birth year] through 1963. Anyone with known risk factors for hepatitis C. Sexually transmitted infections (STIs) You should be screened each year for STIs, including gonorrhea and chlamydia, if: You are sexually active and are younger than 63 years of age. You are older than 63 years of age and your health care provider tells you that you are at risk for this type of infection. Your sexual activity has changed since you were last screened, and you are at increased risk for chlamydia or gonorrhea. Ask your health care provider if you are at risk. Ask your health care provider about whether you are at high risk for HIV. Your health care provider may recommend a prescription medicine to help prevent HIV infection. If you choose to take medicine to prevent HIV, you should first get tested for HIV. You should then be tested every 3 months for as long as you are taking the medicine. Follow these instructions at home: Lifestyle Do not use any products that contain nicotine or tobacco, such as cigarettes, e-cigarettes, and chewing tobacco. If you need help quitting, ask your health care provider. Do not use street drugs. Do not share needles. Ask your health care provider for help if you need support  or information about quitting drugs. Alcohol use Do not drink alcohol if your health care provider tells you not to drink. If you drink alcohol: Limit how much you have to 0-2 drinks a day. Be aware of how much alcohol is in your drink. In the [place] one drink equals one 12 oz bottle of beer (355 mL), one 5 oz glass of wine (148 mL), or one 1 oz glass of hard liquor (44 mL). General instructions Schedule regular health, dental, and eye exams. Stay current with your vaccines. Tell your health care provider if: You often feel depressed. You have ever been abused or do not feel safe at home. Summary Adopting a healthy lifestyle and getting preventive care are important in promoting health and wellness. Follow your health care provider's instructions about healthy diet, exercising, and getting tested or screened for diseases. Follow your health care provider's instructions on monitoring your cholesterol and blood pressure. This information is not intended to replace advice given to you by your health care provider. Make sure you discuss any questions you have with your healthcare provider. Document Revised: 12/12/2017 Document Reviewed: 12/12/2017 Elsevier Patient Education  2022 [employer].    Rafael Hernandez. Joliet Mallozzi M.D.

## 2020-08-23 ENCOUNTER — Other Ambulatory Visit: Payer: Self-pay

## 2020-08-23 ENCOUNTER — Ambulatory Visit (INDEPENDENT_AMBULATORY_CARE_PROVIDER_SITE_OTHER): Payer: BC Managed Care – PPO | Admitting: Internal Medicine

## 2020-08-23 ENCOUNTER — Encounter: Payer: Self-pay | Admitting: Internal Medicine

## 2020-08-23 VITALS — BP 110/70 | HR 51 | Temp 98.6°F | Ht 70.5 in | Wt 196.2 lb

## 2020-08-23 DIAGNOSIS — E785 Hyperlipidemia, unspecified: Secondary | ICD-10-CM

## 2020-08-23 DIAGNOSIS — Z79899 Other long term (current) drug therapy: Secondary | ICD-10-CM

## 2020-08-23 DIAGNOSIS — L80 Vitiligo: Secondary | ICD-10-CM

## 2020-08-23 DIAGNOSIS — Z125 Encounter for screening for malignant neoplasm of prostate: Secondary | ICD-10-CM

## 2020-08-23 DIAGNOSIS — I10 Essential (primary) hypertension: Secondary | ICD-10-CM

## 2020-08-23 DIAGNOSIS — Z0000 Encounter for general adult medical examination without abnormal findings: Secondary | ICD-10-CM | POA: Diagnosis not present

## 2020-08-23 DIAGNOSIS — Z833 Family history of diabetes mellitus: Secondary | ICD-10-CM

## 2020-08-23 LAB — HEPATIC FUNCTION PANEL
ALT: 27 U/L (ref 0–53)
AST: 22 U/L (ref 0–37)
Albumin: 4.1 g/dL (ref 3.5–5.2)
Alkaline Phosphatase: 63 U/L (ref 39–117)
Bilirubin, Direct: 0.2 mg/dL (ref 0.0–0.3)
Total Bilirubin: 1.2 mg/dL (ref 0.2–1.2)
Total Protein: 6.9 g/dL (ref 6.0–8.3)

## 2020-08-23 LAB — LIPID PANEL
Cholesterol: 115 mg/dL (ref 0–200)
HDL: 51.1 mg/dL (ref >39.00)
LDL Cholesterol: 48 mg/dL (ref 0–99)
NonHDL: 64.07
Total CHOL/HDL Ratio: 2
Triglycerides: 80 mg/dL (ref 0.0–149.0)
VLDL: 16 mg/dL (ref 0.0–40.0)

## 2020-08-23 LAB — CBC WITH DIFFERENTIAL/PLATELET
Basophils Absolute: 0 10*3/uL (ref 0.0–0.1)
Basophils Relative: 0.7 % (ref 0.0–3.0)
Eosinophils Absolute: 0.1 10*3/uL (ref 0.0–0.7)
Eosinophils Relative: 2.2 % (ref 0.0–5.0)
HCT: 45.9 % (ref 39.0–52.0)
Hemoglobin: 14.8 g/dL (ref 13.0–17.0)
Lymphocytes Relative: 30 % (ref 12.0–46.0)
Lymphs Abs: 1.3 10*3/uL (ref 0.7–4.0)
MCHC: 32.2 g/dL (ref 30.0–36.0)
MCV: 78.1 fl (ref 78.0–100.0)
Monocytes Absolute: 0.5 10*3/uL (ref 0.1–1.0)
Monocytes Relative: 10.7 % (ref 3.0–12.0)
Neutro Abs: 2.4 10*3/uL (ref 1.4–7.7)
Neutrophils Relative %: 56.4 % (ref 43.0–77.0)
Platelets: 132 10*3/uL — ABNORMAL LOW (ref 150.0–400.0)
RBC: 5.88 Mil/uL — ABNORMAL HIGH (ref 4.22–5.81)
RDW: 14.5 % (ref 11.5–15.5)
WBC: 4.3 10*3/uL (ref 4.0–10.5)

## 2020-08-23 LAB — BASIC METABOLIC PANEL
BUN: 23 mg/dL (ref 6–23)
CO2: 27 mEq/L (ref 19–32)
Calcium: 9 mg/dL (ref 8.4–10.5)
Chloride: 104 mEq/L (ref 96–112)
Creatinine, Ser: 1.01 mg/dL (ref 0.40–1.50)
GFR: 79.58 mL/min (ref >60.00)
Glucose, Bld: 89 mg/dL (ref 70–99)
Potassium: 4.1 mEq/L (ref 3.5–5.1)
Sodium: 138 mEq/L (ref 135–145)

## 2020-08-23 LAB — VITAMIN B12: Vitamin B-12: 405 pg/mL (ref 211–911)

## 2020-08-23 LAB — TSH: TSH: 1.15 u[IU]/mL (ref 0.35–5.50)

## 2020-08-23 LAB — HEMOGLOBIN A1C: Hgb A1c MFr Bld: 6 % (ref 4.6–6.5)

## 2020-08-23 LAB — PSA: PSA: 1.18 ng/mL (ref 0.10–4.00)

## 2020-08-23 NOTE — Patient Instructions (Signed)
Good to see  you today .  The swelling area may be a mild  abd wall hernia  .   At this time  observe if getting pain increasing swelling  can reevaluate.   Continue lifestyle intervention healthy eating and exercise .   If all ok then  yearly cpx and labs .  Health Maintenance, Male Adopting a healthy lifestyle and getting preventive care are important in promoting health and wellness. Ask your health care provider about: The right schedule for you to have regular tests and exams. Things you can do on your own to prevent diseases and keep yourself healthy. What should I know about diet, weight, and exercise? Eat a healthy diet  Eat a diet that includes plenty of vegetables, fruits, low-fat dairy products, and lean protein. Do not eat a lot of foods that are high in solid fats, added sugars, or sodium.  Maintain a healthy weight Body mass index (BMI) is a measurement that can be used to identify possible weight problems. It estimates body fat based on height and weight. Your health care provider can help determine your BMI and help you achieve or maintain ahealthy weight. Get regular exercise Get regular exercise. This is one of the most important things you can do for your health. Most adults should: Exercise for at least 150 minutes each week. The exercise should increase your heart rate and make you sweat (moderate-intensity exercise). Do strengthening exercises at least twice a week. This is in addition to the moderate-intensity exercise. Spend less time sitting. Even light physical activity can be beneficial. Watch cholesterol and blood lipids Have your blood tested for lipids and cholesterol at 63 years of age, then havethis test every 5 years. You may need to have your cholesterol levels checked more often if: Your lipid or cholesterol levels are high. You are older than 63 years of age. You are at high risk for heart disease. What should I know about cancer screening? Many  types of cancers can be detected early and may often be prevented. Depending on your health history and family history, you may need to have cancer screening at various ages. This may include screening for: Colorectal cancer. Prostate cancer. Skin cancer. Lung cancer. What should I know about heart disease, diabetes, and high blood pressure? Blood pressure and heart disease High blood pressure causes heart disease and increases the risk of stroke. This is more likely to develop in people who have high blood pressure readings, are of African descent, or are overweight. Talk with your health care provider about your target blood pressure readings. Have your blood pressure checked: Every 3-5 years if you are 69-41 years of age. Every year if you are 44 years old or older. If you are between the ages of 61 and 65 and are a current or former smoker, ask your health care provider if you should have a one-time screening for abdominal aortic aneurysm (AAA). Diabetes Have regular diabetes screenings. This checks your fasting blood sugar level. Have the screening done: Once every three years after age 44 if you are at a normal weight and have a low risk for diabetes. More often and at a younger age if you are overweight or have a high risk for diabetes. What should I know about preventing infection? Hepatitis B If you have a higher risk for hepatitis B, you should be screened for this virus. Talk with your health care provider to find out if you are at risk forhepatitis B  infection. Hepatitis C Blood testing is recommended for: Everyone born from [birth year] through 1963. Anyone with known risk factors for hepatitis C. Sexually transmitted infections (STIs) You should be screened each year for STIs, including gonorrhea and chlamydia, if: You are sexually active and are younger than 63 years of age. You are older than 63 years of age and your health care provider tells you that you are at risk for this type  of infection. Your sexual activity has changed since you were last screened, and you are at increased risk for chlamydia or gonorrhea. Ask your health care provider if you are at risk. Ask your health care provider about whether you are at high risk for HIV. Your health care provider may recommend a prescription medicine to help prevent HIV infection. If you choose to take medicine to prevent HIV, you should first get tested for HIV. You should then be tested every 3 months for as long as you are taking the medicine. Follow these instructions at home: Lifestyle Do not use any products that contain nicotine or tobacco, such as cigarettes, e-cigarettes, and chewing tobacco. If you need help quitting, ask your health care provider. Do not use street drugs. Do not share needles. Ask your health care provider for help if you need support or information about quitting drugs. Alcohol use Do not drink alcohol if your health care provider tells you not to drink. If you drink alcohol: Limit how much you have to 0-2 drinks a day. Be aware of how much alcohol is in your drink. In the [place] one drink equals one 12 oz bottle of beer (355 mL), one 5 oz glass of wine (148 mL), or one 1 oz glass of hard liquor (44 mL). General instructions Schedule regular health, dental, and eye exams. Stay current with your vaccines. Tell your health care provider if: You often feel depressed. You have ever been abused or do not feel safe at home. Summary Adopting a healthy lifestyle and getting preventive care are important in promoting health and wellness. Follow your health care provider's instructions about healthy diet, exercising, and getting tested or screened for diseases. Follow your health care provider's instructions on monitoring your cholesterol and blood pressure. This information is not intended to replace advice given to you by your health care provider. Make sure you discuss any questions you have with  your healthcare provider. Document Revised: 12/12/2017 Document Reviewed: 12/12/2017 Elsevier Patient Education  2022 [employer].

## 2020-08-24 NOTE — Progress Notes (Signed)
Hemoglobin A1c slightly higher than last year but blood sugar normal Lipids at goal Blood count stable platelets tend to run low but minimally.  Not currently clinically significant. Rest of blood results are normal. Forwarding information to Dr. Talmage Nap

## 2020-08-25 DIAGNOSIS — E119 Type 2 diabetes mellitus without complications: Secondary | ICD-10-CM | POA: Diagnosis not present

## 2020-08-25 DIAGNOSIS — E785 Hyperlipidemia, unspecified: Secondary | ICD-10-CM | POA: Diagnosis not present

## 2020-08-25 DIAGNOSIS — D696 Thrombocytopenia, unspecified: Secondary | ICD-10-CM | POA: Diagnosis not present

## 2020-08-28 ENCOUNTER — Other Ambulatory Visit: Payer: Self-pay | Admitting: Internal Medicine

## 2020-09-16 DIAGNOSIS — H524 Presbyopia: Secondary | ICD-10-CM | POA: Diagnosis not present

## 2020-09-16 DIAGNOSIS — H04123 Dry eye syndrome of bilateral lacrimal glands: Secondary | ICD-10-CM | POA: Diagnosis not present

## 2020-09-16 DIAGNOSIS — H2513 Age-related nuclear cataract, bilateral: Secondary | ICD-10-CM | POA: Diagnosis not present

## 2020-09-16 DIAGNOSIS — H53002 Unspecified amblyopia, left eye: Secondary | ICD-10-CM | POA: Diagnosis not present

## 2020-09-30 ENCOUNTER — Other Ambulatory Visit: Payer: Self-pay

## 2020-09-30 DIAGNOSIS — I714 Abdominal aortic aneurysm, without rupture, unspecified: Secondary | ICD-10-CM

## 2020-10-12 ENCOUNTER — Encounter: Payer: Self-pay | Admitting: Vascular Surgery

## 2020-10-12 ENCOUNTER — Ambulatory Visit (HOSPITAL_COMMUNITY)
Admission: RE | Admit: 2020-10-12 | Discharge: 2020-10-12 | Disposition: A | Discharge status: Home / Self Care | Payer: BC Managed Care – PPO | Source: Ambulatory Visit | Attending: Vascular Surgery | Admitting: Vascular Surgery

## 2020-10-12 ENCOUNTER — Other Ambulatory Visit: Payer: Self-pay

## 2020-10-12 ENCOUNTER — Ambulatory Visit: Payer: BC Managed Care – PPO | Admitting: Vascular Surgery

## 2020-10-12 VITALS — BP 147/85 | HR 50 | Temp 98.0°F | Resp 16 | Ht 70.0 in | Wt 196.0 lb

## 2020-10-12 DIAGNOSIS — I7143 Infrarenal abdominal aortic aneurysm, without rupture: Secondary | ICD-10-CM | POA: Diagnosis not present

## 2020-10-12 DIAGNOSIS — I714 Abdominal aortic aneurysm, without rupture, unspecified: Secondary | ICD-10-CM | POA: Diagnosis not present

## 2020-10-12 NOTE — Progress Notes (Signed)
Patient name: Jake Delgado MRN: [id number] DOB: [birth year] Sex: male  REASON FOR CONSULT: 1 year follow-up abdominal aortic aneurysm  HPI: Takashi Korol is a 65 y.o. male, with history of hypertension and obstructive sleep apnea who presents for 1 year follow-up of a 4 cm abdominal aortic aneurysm.  Patient states he initially knew about his aneurysm in 2015 when he had an ultrasound.  He has no abdominal or back pain today.  No new concerns other than some intermittent left calf pain.  Past Medical History:  Diagnosis Date   Allergy    seasonal   H/O left knee surgery    MCL 2008 Dr. Despina Hick   History of exercise stress test 2015   exercise cardiopulmonary  test   History of varicella    Hypertension    Sleep apnea    was tested-no issues found.   Vitiligo    when younger no treatment doesn't run in families.    Past Surgical History:  Procedure Laterality Date   CARDIOVASCULAR STRESS TEST     COLONOSCOPY  2011   dental implants     KNEE ARTHROSCOPY      Family History  Problem Relation Age of Onset   Hypertension Mother    Diabetes Mellitus II Mother    Heart disease Other        maternal uncle in his 74s   Colon cancer Neg Hx    Colon polyps Neg Hx    Esophageal cancer Neg Hx    Stomach cancer Neg Hx    Rectal cancer Neg Hx     SOCIAL HISTORY: Social History   Socioeconomic History   Marital status: Married    Spouse name: Not on file   Number of children: Not on file   Years of education: Not on file   Highest education level: Not on file  Occupational History   Occupation: Surveyor, minerals  Tobacco Use   Smoking status: Never   Smokeless tobacco: Never  Vaping Use   Vaping Use: Never used  Substance and Sexual Activity   Alcohol use: No   Drug use: No   Sexual activity: Not on file  Other Topics Concern   Not on file  Social History Narrative   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   Give ETS FA   Vegetarian but  Pos dairly and sometimes meat   Social Determinants of Health   Financial Resource Strain: Not on file  Food Insecurity: Not on file  Transportation Needs: Not on file  Physical Activity: Not on file  Stress: Not on file  Social Connections: Not on file  Intimate Partner Violence: Not on file    Allergies  Allergen Reactions   Ansaid [Flurbiprofen] Other (See Comments)    Patients wife stated that he can not take any Ansaids   Aspirin Other (See Comments)   Dust Mite Extract    Ibuprofen Other (See Comments)    REACTION: swelling neck and difficulty breathing    Current Outpatient Medications  Medication Sig Dispense Refill   atorvastatin (LIPITOR) 20 MG tablet TAKE 1 TABLET BY MOUTH EVERY DAY 90 tablet 3   levocetirizine (XYZAL) 5 MG tablet Take 5  mg by mouth every evening.     losartan (COZAAR) 50 MG tablet TAKE 1 TABLET BY MOUTH EVERY DAY 90 tablet 3   No current facility-administered medications for this visit.    REVIEW OF SYSTEMS:  [X]  denotes positive finding, [ ]  denotes negative finding Cardiac  Comments:  Chest pain or chest pressure:    Shortness of breath upon exertion:    Short of breath when lying flat:    Irregular heart rhythm:        Vascular    Pain in calf, thigh, or hip brought on by ambulation:    Pain in feet at night that wakes you up from your sleep:     Blood clot in your veins:    Leg swelling:         Pulmonary    Oxygen at home:    Productive cough:     Wheezing:         Neurologic    Sudden weakness in arms or legs:     Sudden numbness in arms or legs:     Sudden onset of difficulty speaking or slurred speech:    Temporary loss of vision in one eye:     Problems with dizziness:         Gastrointestinal    Blood in stool:     Vomited  blood:         Genitourinary    Burning when urinating:     Blood in urine:        Psychiatric    Major depression:         Hematologic    Bleeding problems:    Problems with blood clotting too easily:        Skin    Rashes or ulcers:        Constitutional    Fever or chills:      PHYSICAL EXAM: Vitals:   10/12/20 0911  BP: (!) 147/85  Pulse: (!) 50  Resp: 16  Temp: 98 F (36.7 C)  TempSrc: Temporal  SpO2: 95%  Weight: 196 lb (88.9 kg)  Height: 5\' 10"  (1.778 m)    GENERAL: The patient is a well-nourished male, in no acute distress. The vital signs are documented above. CARDIAC: There is a regular rate and rhythm.  VASCULAR:  2+ femoral pulses bilaterally 2+ dorsalis pedis palpable bilaterally PULMONARY: No respiratory distress ABDOMEN: Soft and non-tender.  No significant pain with palpation. MUSCULOSKELETAL: There are no major deformities or cyanosis. NEUROLOGIC: No focal weakness or paresthesias are detected. SKIN: There are no ulcers or rashes noted. PSYCHIATRIC: The patient has a normal affect.  DATA:    AAA duplex today unable to visualize aneurysm even with 2 technologist.  Assessment/Plan:  65 year old male presents for 1 year follow-up of a 4 cm abdominal aortic aneurysm.  Discussed with him that on ultrasound in our office today unable to visualize any evidence of aneurysm even with a second technologist performing the study.  We discussed options moving forward and ultimately agreed on a CT abdomen pelvis without contrast to determine if he indeed has an aneurysm given previous ultrasound imaging has been at a referring facility with concern for a 4 cm aneurysm.  This ultimately will let 12/12/20 make a decision about how to move forward with any needs for additional surveillance.  I will set up a phone visit once his CT is complete.   , MD [facility] Office: [phone]

## 2020-10-18 ENCOUNTER — Other Ambulatory Visit: Payer: Self-pay

## 2020-10-19 ENCOUNTER — Other Ambulatory Visit: Payer: Self-pay

## 2020-10-19 ENCOUNTER — Telehealth: Payer: Self-pay

## 2020-10-19 DIAGNOSIS — I714 Abdominal aortic aneurysm, without rupture, unspecified: Secondary | ICD-10-CM

## 2020-10-19 DIAGNOSIS — I7143 Infrarenal abdominal aortic aneurysm, without rupture: Secondary | ICD-10-CM

## 2020-10-19 NOTE — Telephone Encounter (Signed)
Patient contacted our office concerned about the wait time for his CT Abdomen/Pelvis.  I advised him that we had to wait for his insurance to approve his case, which they did today.  Patient verbalized understanding and will call [facility] to schedule.

## 2020-11-01 ENCOUNTER — Ambulatory Visit (HOSPITAL_BASED_OUTPATIENT_CLINIC_OR_DEPARTMENT_OTHER)
Admission: RE | Admit: 2020-11-01 | Discharge: 2020-11-01 | Disposition: A | Discharge status: Home / Self Care | Payer: BC Managed Care – PPO | Source: Ambulatory Visit | Attending: Vascular Surgery | Admitting: Vascular Surgery

## 2020-11-01 ENCOUNTER — Other Ambulatory Visit: Payer: Self-pay

## 2020-11-01 ENCOUNTER — Encounter (HOSPITAL_BASED_OUTPATIENT_CLINIC_OR_DEPARTMENT_OTHER): Payer: Self-pay

## 2020-11-01 DIAGNOSIS — I700 Atherosclerosis of aorta: Secondary | ICD-10-CM | POA: Diagnosis not present

## 2020-11-01 DIAGNOSIS — I7143 Infrarenal abdominal aortic aneurysm, without rupture: Secondary | ICD-10-CM | POA: Diagnosis not present

## 2020-11-01 DIAGNOSIS — N400 Enlarged prostate without lower urinary tract symptoms: Secondary | ICD-10-CM | POA: Diagnosis not present

## 2020-11-01 DIAGNOSIS — I714 Abdominal aortic aneurysm, without rupture, unspecified: Secondary | ICD-10-CM | POA: Insufficient documentation

## 2020-11-08 DIAGNOSIS — E119 Type 2 diabetes mellitus without complications: Secondary | ICD-10-CM | POA: Diagnosis not present

## 2020-11-09 ENCOUNTER — Encounter: Payer: Self-pay | Admitting: Vascular Surgery

## 2020-11-09 ENCOUNTER — Ambulatory Visit (INDEPENDENT_AMBULATORY_CARE_PROVIDER_SITE_OTHER): Payer: BC Managed Care – PPO | Admitting: Vascular Surgery

## 2020-11-09 VITALS — BP 130/80 | HR 55 | Ht 70.0 in | Wt 182.0 lb

## 2020-11-09 DIAGNOSIS — I10 Essential (primary) hypertension: Secondary | ICD-10-CM | POA: Diagnosis not present

## 2020-11-09 NOTE — Progress Notes (Signed)
    Virtual Visit via Telephone Note   I connected with Jake Delgado on 11/09/2020 using the [employer] by telephone and verified that I was speaking with the correct person using two identifiers. Patient was located at home and I am located at [facility] office.   The limitations of evaluation and management by telemedicine and the availability of in person appointments have been previously discussed with the patient and are documented in the patients chart. The patient expressed understanding and consented to proceed.  PCP: Jake Headings, MD  Chief Complaint: Follow-up after CT for evaluation of abdominal aortic aneurysm  History of Present Illness: Jake Delgado is a 65 y.o. male that was initially referred to our office last year for a 4 cm abdominal aortic aneurysm based on ultrasound at referring location.  On 1 year follow-up this year we could not identify his aneurysm on ultrasound even with a second technologist.  We recommended a CT to confirm if he does have an aneurysm to help with future surveillance.  Past Medical History:  Diagnosis Date   Allergy    seasonal   H/O left knee surgery    MCL 2008 Dr. Despina Hick   History of exercise stress test 2015   exercise cardiopulmonary  test   History of varicella    Hypertension    Sleep apnea    was tested-no issues found.   Vitiligo    when younger no treatment doesn't run in families.    Past Surgical History:  Procedure Laterality Date   CARDIOVASCULAR STRESS TEST     COLONOSCOPY  2011   dental implants     KNEE ARTHROSCOPY      Current Meds  Medication Sig   levocetirizine (XYZAL) 5 MG tablet Take 5 mg by mouth every evening.   losartan (COZAAR) 50 MG tablet TAKE 1 TABLET BY MOUTH EVERY DAY    12 system ROS was negative unless otherwise noted in HPI   Observations/Objective:  CT abdomen pelvis without contrast reviewed from 11/01/2020 with no evidence of abdominal aortic aneurysm  Assessment and Plan:   65 year old  male presents to discuss results of CT scan after there was a discrepancy in his ultrasound study.  As noted above, he had an ultrasound suggesting a 4 cm abdominal aortic aneurysm last year at a referring institution and on follow-up at our office we could not find any evidence of an aneurysm on repeat scan.  We recommended a CT scan given the discrepancy.  I confirmed with him that his CT shows no evidence of abdominal aortic aneurysm which I reviewed personally.  This is great news for Jake Delgado.  He has no reason to follow-up in my office at this time.  Available if needed in the future.  Follow Up Instructions:   Follow up: PRN   I discussed the assessment and treatment plan with the patient. The patient was provided an opportunity to ask questions and all were answered. The patient agreed with the plan and demonstrated an understanding of the instructions.   The patient was advised to call back or seek an in-person evaluation if the symptoms worsen or if the condition fails to improve as anticipated.  I spent 5 minutes with the patient via telephone encounter.   Signed, Jake Delgado [facility] Office: [phone]  11/09/2020, 2:12 PM

## 2020-12-15 ENCOUNTER — Encounter: Payer: Self-pay | Admitting: Internal Medicine

## 2020-12-15 NOTE — Telephone Encounter (Signed)
Glad the Ultrasound showed no aortic enlargement.  I advise staying on atorvastatin  if not causing any side effect    the coronary artery calcium score showed some plaque buildup and the atorva  is to reduce risk of  future heart attack (not related to the aortic size. ) However taking the statin medication is your  choice since this is a preventive  measure .

## 2020-12-20 NOTE — Telephone Encounter (Signed)
We certainly can try another  statin medication that may tolerate better.   Can stop the atorvastatin for a few weeks and see if cramps resolve. And be attributed to medication .   If so ,    we can  try a different  statin  .  At lower dose . Often the sx if from the medication is dose related .   We could switch to Crestor 5 mg per day   we can titrate the dose and adjust if sx .   So contact us after stopping the atorva as above and how doing ,to decide next step

## 2021-01-20 NOTE — Telephone Encounter (Signed)
I agree  with trial off  atorvastationand we can always try a different med  lower dose etc .  Thanks for the update

## 2021-01-20 NOTE — Telephone Encounter (Signed)
Pt states he has been taking Atorvastatin 10mg  since the message was sent & the cramps have lessened, although he does intermittently experience them. Pt advised that he can d/c the medication for a couple weeks to determine if the cramps will resolve completely & possibly try a different statin medication. Pt states he will d/c Atrovastatin & update PCP on cramps to determine next steps.

## 2021-07-06 DIAGNOSIS — B078 Other viral warts: Secondary | ICD-10-CM | POA: Diagnosis not present

## 2021-07-06 DIAGNOSIS — L821 Other seborrheic keratosis: Secondary | ICD-10-CM | POA: Diagnosis not present

## 2021-07-06 DIAGNOSIS — D2261 Melanocytic nevi of right upper limb, including shoulder: Secondary | ICD-10-CM | POA: Diagnosis not present

## 2021-07-06 DIAGNOSIS — D2262 Melanocytic nevi of left upper limb, including shoulder: Secondary | ICD-10-CM | POA: Diagnosis not present

## 2021-07-06 DIAGNOSIS — L820 Inflamed seborrheic keratosis: Secondary | ICD-10-CM | POA: Diagnosis not present

## 2021-07-06 DIAGNOSIS — L738 Other specified follicular disorders: Secondary | ICD-10-CM | POA: Diagnosis not present

## 2021-08-22 ENCOUNTER — Encounter: Payer: Self-pay | Admitting: Internal Medicine

## 2021-08-23 NOTE — Telephone Encounter (Signed)
Ok will  order at physical

## 2021-08-24 ENCOUNTER — Encounter: Payer: Self-pay | Admitting: Internal Medicine

## 2021-08-24 ENCOUNTER — Ambulatory Visit (INDEPENDENT_AMBULATORY_CARE_PROVIDER_SITE_OTHER): Payer: BC Managed Care – PPO | Admitting: Internal Medicine

## 2021-08-24 VITALS — BP 138/100 | HR 55 | Temp 98.0°F | Ht 70.0 in | Wt 195.2 lb

## 2021-08-24 DIAGNOSIS — R931 Abnormal findings on diagnostic imaging of heart and coronary circulation: Secondary | ICD-10-CM

## 2021-08-24 DIAGNOSIS — Z125 Encounter for screening for malignant neoplasm of prostate: Secondary | ICD-10-CM

## 2021-08-24 DIAGNOSIS — Z0000 Encounter for general adult medical examination without abnormal findings: Secondary | ICD-10-CM | POA: Diagnosis not present

## 2021-08-24 DIAGNOSIS — R739 Hyperglycemia, unspecified: Secondary | ICD-10-CM

## 2021-08-24 DIAGNOSIS — Z79899 Other long term (current) drug therapy: Secondary | ICD-10-CM | POA: Diagnosis not present

## 2021-08-24 DIAGNOSIS — T887XXA Unspecified adverse effect of drug or medicament, initial encounter: Secondary | ICD-10-CM

## 2021-08-24 DIAGNOSIS — I10 Essential (primary) hypertension: Secondary | ICD-10-CM

## 2021-08-24 DIAGNOSIS — Z833 Family history of diabetes mellitus: Secondary | ICD-10-CM

## 2021-08-24 DIAGNOSIS — E785 Hyperlipidemia, unspecified: Secondary | ICD-10-CM

## 2021-08-24 DIAGNOSIS — R252 Cramp and spasm: Secondary | ICD-10-CM

## 2021-08-24 DIAGNOSIS — Z8249 Family history of ischemic heart disease and other diseases of the circulatory system: Secondary | ICD-10-CM

## 2021-08-24 DIAGNOSIS — L80 Vitiligo: Secondary | ICD-10-CM

## 2021-08-24 LAB — CBC WITH DIFFERENTIAL/PLATELET
Basophils Absolute: 0 10*3/uL (ref 0.0–0.1)
Basophils Relative: 0.6 % (ref 0.0–3.0)
Eosinophils Absolute: 0.1 10*3/uL (ref 0.0–0.7)
Eosinophils Relative: 2.7 % (ref 0.0–5.0)
HCT: 48.3 % (ref 39.0–52.0)
Hemoglobin: 15.3 g/dL (ref 13.0–17.0)
Lymphocytes Relative: 22.2 % (ref 12.0–46.0)
Lymphs Abs: 1.1 10*3/uL (ref 0.7–4.0)
MCHC: 31.8 g/dL (ref 30.0–36.0)
MCV: 79.5 fl (ref 78.0–100.0)
Monocytes Absolute: 0.7 10*3/uL (ref 0.1–1.0)
Monocytes Relative: 14.8 % — ABNORMAL HIGH (ref 3.0–12.0)
Neutro Abs: 2.9 10*3/uL (ref 1.4–7.7)
Neutrophils Relative %: 59.7 % (ref 43.0–77.0)
Platelets: 119 10*3/uL — ABNORMAL LOW (ref 150.0–400.0)
RBC: 6.08 Mil/uL — ABNORMAL HIGH (ref 4.22–5.81)
RDW: 14.8 % (ref 11.5–15.5)
WBC: 4.8 10*3/uL (ref 4.0–10.5)

## 2021-08-24 LAB — PSA: PSA: 1.87 ng/mL (ref 0.10–4.00)

## 2021-08-24 LAB — BASIC METABOLIC PANEL
BUN: 20 mg/dL (ref 6–23)
CO2: 27 mEq/L (ref 19–32)
Calcium: 9.1 mg/dL (ref 8.4–10.5)
Chloride: 98 mEq/L (ref 96–112)
Creatinine, Ser: 1.02 mg/dL (ref 0.40–1.50)
GFR: 78.09 mL/min (ref >60.00)
Glucose, Bld: 92 mg/dL (ref 70–99)
Potassium: 4.4 mEq/L (ref 3.5–5.1)
Sodium: 137 mEq/L (ref 135–145)

## 2021-08-24 LAB — LIPID PANEL
Cholesterol: 139 mg/dL (ref 0–200)
HDL: 45.4 mg/dL (ref >39.00)
LDL Cholesterol: 69 mg/dL (ref 0–99)
NonHDL: 94.01
Total CHOL/HDL Ratio: 3
Triglycerides: 127 mg/dL (ref 0.0–149.0)
VLDL: 25.4 mg/dL (ref 0.0–40.0)

## 2021-08-24 LAB — POCT GLUCOSE (DEVICE FOR HOME USE): Glucose Fasting, POC: 103 mg/dL — AB (ref 70–99)

## 2021-08-24 LAB — TSH: TSH: 1.12 u[IU]/mL (ref 0.35–5.50)

## 2021-08-24 LAB — HEPATIC FUNCTION PANEL
ALT: 19 U/L (ref 0–53)
AST: 21 U/L (ref 0–37)
Albumin: 4.2 g/dL (ref 3.5–5.2)
Alkaline Phosphatase: 72 U/L (ref 39–117)
Bilirubin, Direct: 0.1 mg/dL (ref 0.0–0.3)
Total Bilirubin: 0.7 mg/dL (ref 0.2–1.2)
Total Protein: 6.8 g/dL (ref 6.0–8.3)

## 2021-08-24 LAB — MICROALBUMIN / CREATININE URINE RATIO
Creatinine,U: 141.9 mg/dL
Microalb Creat Ratio: 0.5 mg/g (ref 0.0–30.0)
Microalb, Ur: 0.7 mg/dL (ref 0.0–1.9)

## 2021-08-24 LAB — VITAMIN D 25 HYDROXY (VIT D DEFICIENCY, FRACTURES): VITD: 22.94 ng/mL — ABNORMAL LOW (ref 30.00–100.00)

## 2021-08-24 LAB — HEMOGLOBIN A1C: Hgb A1c MFr Bld: 6.1 % (ref 4.6–6.5)

## 2021-08-24 LAB — MAGNESIUM: Magnesium: 1.8 mg/dL (ref 1.5–2.5)

## 2021-08-24 NOTE — Progress Notes (Signed)
Blood work stable or normal  A1c 6.1 Vitamin D is somewhat low please add 2000 international units of vitamin D a day. If you are willing we can try 5 mg of rosuvastatin once a day Dispense 30 refill x3 Let us know if you are getting any side effects.

## 2021-08-24 NOTE — Patient Instructions (Addendum)
Good to see you today .  Send in  Bp readings   twice a day for 3-5 days to document BP control .  Continue lifestyle intervention healthy eating and exercise . Avoiding sugars etc .  We may want tot try a different statin low dose   to see if can tolerate .   Consdier rsv in fall and flu vaccine.

## 2021-08-24 NOTE — Progress Notes (Signed)
Chief Complaint  Patient presents with   Annual Exam    HPI: Patient  Jake Delgado  66 y.o. comes in today for Preventive Health Care visit  Basically doing well stop the atorvastatin and musculoskeletal complaints resolved or improved except for occasional nocturnal left thigh cramp.  Is able to exercise without difficulty. Blood pressure at home is in the 110/80 range and generally well. Sees specialist dermatology [name] yearly for her vitiligo. No longer seeing Dr. Cleon Gustin because blood sugar etc. is controlled. No active pulmonary symptoms No need to follow-up vascular as question of AAA showed no finding on abdominal CT   Health Maintenance  Topic Date Due   INFLUENZA VACCINE  08/02/2021   COVID-19 Vaccine (6 - Mixed Product risk series) 09/09/2021 (Originally 11/20/2020)   TETANUS/TDAP  01/14/2024   COLONOSCOPY (Pts 45-37yrs Insurance coverage will need to be confirmed)  06/10/2029   Hepatitis C Screening  Completed   HIV Screening  Completed   Zoster Vaccines- Shingrix  Completed   HPV VACCINES  Aged Out   Health Maintenance Review LIFESTYLE:  Exercise:   daily no limitiation Tobacco/ETS:n Alcohol: NA Sugar beverages:not reg  Sleep:6-7 Drug use: no HH of 2 no pets  Work: FT own work Biochemist, clinical  travels monthly now    ROS:   REST of 12 system review negative except as per HPI ocass twinge no assoc sx chest     Past Medical History:  Diagnosis Date   Allergy    seasonal   H/O left knee surgery    MCL 2008 Dr. Despina Hick   History of exercise stress test 2015   exercise cardiopulmonary  test   History of varicella    Hypertension    Sleep apnea    was tested-no issues found.   Vitiligo    when younger no treatment doesn't run in families.    Past Surgical History:  Procedure Laterality Date   CARDIOVASCULAR STRESS TEST     COLONOSCOPY  2011   dental implants     KNEE ARTHROSCOPY      Family History  Problem Relation Age  of Onset   Hypertension Mother    Diabetes Mellitus II Mother    Heart disease Other        maternal uncle in his 17s   Colon cancer Neg Hx    Colon polyps Neg Hx    Esophageal cancer Neg Hx    Stomach cancer Neg Hx    Rectal cancer Neg Hx     Social History   Socioeconomic History   Marital status: Married    Spouse name: Not on file   Number of children: Not on file   Years of education: Not on file   Highest education level: Not on file  Occupational History   Occupation: Surveyor, minerals  Tobacco Use   Smoking status: Never   Smokeless tobacco: Never  Vaping Use   Vaping Use: Never used  Substance and Sexual Activity   Alcohol use: No   Drug use: No   Sexual activity: Not on file  Other Topics Concern   Not on file  Social History Narrative   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   Give ETS FA   Vegetarian but  Pos dairly and sometimes meat   Social Determinants of Health   Financial Resource Strain: Not on file  Food Insecurity: Not on file  Transportation Needs: Not on file  Physical Activity: Not on file  Stress: Not on file  Social Connections: Not on file    Outpatient Medications Prior to Visit  Medication Sig Dispense Refill   levocetirizine (XYZAL) 5 MG tablet Take 5 mg by mouth every evening.     losartan (COZAAR) 50 MG tablet TAKE 1 TABLET BY MOUTH EVERY DAY 90 tablet 3   atorvastatin (LIPITOR) 20 MG tablet TAKE 1 TABLET BY MOUTH EVERY DAY (Patient not taking: Reported on 11/09/2020) 90 tablet 3   No facility-administered medications prior to visit.     EXAM:  BP (!) 138/100 (BP Location: Right Arm, Cuff Size: Normal)   Pulse (!) 55   Temp 98 F (36.7 C) (Oral)   Ht 5\' 10"  (1.778 m)   Wt 195 lb 3.2 oz (88.5 kg)    SpO2 94%   BMI 28.01 kg/m   Body mass index is 28.01 kg/m. Wt Readings from Last 3 Encounters:  08/24/21 195 lb 3.2 oz (88.5 kg)  11/09/20 182 lb (82.6 kg)  10/12/20 196 lb (88.9 kg)    Physical Exam: Vital signs reviewed 12/12/20 is a well-developed well-nourished alert cooperative    who appearsr stated age in no acute distress.  HEENT: normocephalic atraumatic , Eyes: PERRL EOM's full, conjunctiva clear, Nares: paten,t no deformity discharge or tenderness., Ears: no deformity EAC's clear TMs with normal landmarks.   Moist mucous membranes. Dentition in adequate repair. NECK: supple without masses, thyromegaly or bruits. CHEST/PULM:  Clear to auscultation and percussion breath sounds equal no wheeze , rales or rhonchi. No chest wall deformities or tenderness.  CV: PMI is nondisplaced, S1 S2 no gallops, murmurs, rubs. Peripheral pulses are full without delay.No JVD .  ABDOMEN: Bowel sounds normal nontender  No guard or rebound, no hepato splenomegal no CVA tenderness.   Extremtities:  No clubbing cyanosis or edema, no acute joint swelling or redness no focal atrophy NEURO:  Oriented x3, cranial nerves 3-12 appear to be intact, no obvious focal weakness,gait within normal limits no abnormal reflexes or asymmetrical SKIN: No acute rashes normal turgor,  no bruising or petechiae. Vtiligo extensive  PSYCH: Oriented, good eye contact, no obvious depression anxiety, cognition and judgment appear normal. LN: no cervical axillary inguinal adenopathy Rectal no masses nl tone prostate 2+no nodules  Lab Results  Component Value Date   WBC 4.3 08/23/2020   HGB 14.8 08/23/2020   HCT 45.9 08/23/2020   PLT 132.0 (L) 08/23/2020   GLUCOSE 89 08/23/2020   CHOL 115 08/23/2020   TRIG 80.0 08/23/2020   HDL 51.10 08/23/2020   LDLCALC 48 08/23/2020   ALT 27 08/23/2020   AST 22 08/23/2020   NA 138 08/23/2020   K 4.1 08/23/2020   CL 104 08/23/2020   CREATININE 1.01 08/23/2020   BUN 23 08/23/2020    CO2 27 08/23/2020   TSH 1.15 08/23/2020   PSA 1.18 08/23/2020   HGBA1C 6.0 08/23/2020    BP Readings from Last 3 Encounters:  08/24/21 (!) 138/100  11/09/20 130/80  10/12/20 (!) 147/85    Lab plan  reviewed with patient   ASSESSMENT AND PLAN:  Discussed the following assessment and plan:    ICD-10-CM   1. Visit for preventive health examination  Z00.00  Basic metabolic panel    CBC with Differential/Platelet    Hemoglobin A1c    Hepatic function panel    Lipid panel    PSA    TSH    Magnesium    VITAMIN D 25 Hydroxy (Vit-D Deficiency, Fractures)    VITAMIN D 25 Hydroxy (Vit-D Deficiency, Fractures)    Magnesium    TSH    PSA    Lipid panel    Hepatic function panel    Hemoglobin A1c    CBC with Differential/Platelet    Basic metabolic panel    2. Medication management  Z79.899 Basic metabolic panel    CBC with Differential/Platelet    Hemoglobin A1c    Hepatic function panel    Lipid panel    PSA    TSH    Microalbumin / creatinine urine ratio    Magnesium    VITAMIN D 25 Hydroxy (Vit-D Deficiency, Fractures)    VITAMIN D 25 Hydroxy (Vit-D Deficiency, Fractures)    Magnesium    Microalbumin / creatinine urine ratio    TSH    PSA    Lipid panel    Hepatic function panel    Hemoglobin A1c    CBC with Differential/Platelet    Basic metabolic panel    3. Hyperlipidemia, unspecified hyperlipidemia type  E78.5 Basic metabolic panel    CBC with Differential/Platelet    Hemoglobin A1c    Hepatic function panel    Lipid panel    PSA    TSH    Magnesium    VITAMIN D 25 Hydroxy (Vit-D Deficiency, Fractures)    VITAMIN D 25 Hydroxy (Vit-D Deficiency, Fractures)    Magnesium    TSH    PSA    Lipid panel    Hepatic function panel    Hemoglobin A1c    CBC with Differential/Platelet    Basic metabolic panel    4. Screening PSA (prostate specific antigen)  Z12.5 PSA    PSA    5. Essential hypertension  I10 Basic metabolic panel    CBC with  Differential/Platelet    Hemoglobin A1c    Hepatic function panel    Lipid panel    PSA    TSH    Microalbumin / creatinine urine ratio    Magnesium    VITAMIN D 25 Hydroxy (Vit-D Deficiency, Fractures)    VITAMIN D 25 Hydroxy (Vit-D Deficiency, Fractures)    Magnesium    Microalbumin / creatinine urine ratio    TSH    PSA    Lipid panel    Hepatic function panel    Hemoglobin A1c    CBC with Differential/Platelet    Basic metabolic panel   ensure control with  hoom readings     6. Elevated coronary artery calcium score  R93.1 Basic metabolic panel    CBC with Differential/Platelet    Hemoglobin A1c    Hepatic function panel    Lipid panel    PSA    TSH    TSH    PSA    Lipid panel    Hepatic function panel    Hemoglobin A1c    CBC with Differential/Platelet    Basic metabolic panel    7. Family history of heart disease  Z82.49 Basic metabolic panel    CBC with Differential/Platelet    Hemoglobin A1c    Hepatic function panel    Lipid panel    PSA    TSH    TSH  PSA    Lipid panel    Hepatic function panel    Hemoglobin A1c    CBC with Differential/Platelet    Basic metabolic panel    8. Elevated blood sugar  R73.9 Basic metabolic panel    CBC with Differential/Platelet    Hemoglobin A1c    Hepatic function panel    Lipid panel    PSA    TSH    POCT Glucose (Device for Home Use)    Microalbumin / creatinine urine ratio    Magnesium    VITAMIN D 25 Hydroxy (Vit-D Deficiency, Fractures)    VITAMIN D 25 Hydroxy (Vit-D Deficiency, Fractures)    Magnesium    Microalbumin / creatinine urine ratio    TSH    PSA    Lipid panel    Hepatic function panel    Hemoglobin A1c    CBC with Differential/Platelet    Basic metabolic panel   prediabeteic vs dm controlled    9. Family history of diabetes mellitus  Z83.3 Basic metabolic panel    CBC with Differential/Platelet    Hemoglobin A1c    Hepatic function panel    Lipid panel    PSA    TSH    TSH     PSA    Lipid panel    Hepatic function panel    Hemoglobin A1c    CBC with Differential/Platelet    Basic metabolic panel    10. Leg cramp  R25.2 Magnesium    VITAMIN D 25 Hydroxy (Vit-D Deficiency, Fractures)    VITAMIN D 25 Hydroxy (Vit-D Deficiency, Fractures)    Magnesium   nocturnal local     11. Vitiligo  L80 Magnesium    VITAMIN D 25 Hydroxy (Vit-D Deficiency, Fractures)    VITAMIN D 25 Hydroxy (Vit-D Deficiency, Fractures)    Magnesium   sees Dr Linton Rump [place]    12. Side effect of medication  T88.7XXA    lipitor  MS and cramps    Depending on results may try a different statin at low-dose as tolerated. Ensure blood pressure control from home readings reported. Discussion about immunizations. Return in about 1 year (around 08/25/2022) for depending on results, preventive /cpx and medications.  Patient Care Team: Elara Cocke, Neta Mends, MD as PCP - General (Internal Medicine) Nyoka Cowden, MD as Consulting Physician (Pulmonary Disease) [place], Amy, MD as Consulting Physician (Dermatology) Patient Instructions  Good to see you today .  Send in  Bp readings   twice a day for 3-5 days to document BP control .  Continue lifestyle intervention healthy eating and exercise . Avoiding sugars etc .  We may want tot try a different statin low dose   to see if can tolerate .   Consdier rsv in fall and flu vaccine.   Neta Mends. Lorelei Heikkila M.D.

## 2021-08-25 ENCOUNTER — Other Ambulatory Visit: Payer: Self-pay

## 2021-08-25 MED ORDER — ROSUVASTATIN CALCIUM 5 MG PO TABS: 5.0000 mg | ORAL_TABLET | Freq: Every day | ORAL | 3 refills | Status: DC

## 2021-08-31 ENCOUNTER — Encounter: Payer: Self-pay | Admitting: Internal Medicine

## 2021-09-11 NOTE — Telephone Encounter (Signed)
Revewied   Update noted  Can continue  periodic readings to make sure Bp in control

## 2021-09-12 ENCOUNTER — Other Ambulatory Visit: Payer: Self-pay | Admitting: Internal Medicine

## 2021-09-23 DIAGNOSIS — H2513 Age-related nuclear cataract, bilateral: Secondary | ICD-10-CM | POA: Diagnosis not present

## 2021-09-23 DIAGNOSIS — H52203 Unspecified astigmatism, bilateral: Secondary | ICD-10-CM | POA: Diagnosis not present

## 2021-09-23 DIAGNOSIS — H35363 Drusen (degenerative) of macula, bilateral: Secondary | ICD-10-CM | POA: Diagnosis not present

## 2021-09-23 DIAGNOSIS — H04123 Dry eye syndrome of bilateral lacrimal glands: Secondary | ICD-10-CM | POA: Diagnosis not present

## 2021-09-23 DIAGNOSIS — H53002 Unspecified amblyopia, left eye: Secondary | ICD-10-CM | POA: Diagnosis not present

## 2021-09-28 ENCOUNTER — Ambulatory Visit (INDEPENDENT_AMBULATORY_CARE_PROVIDER_SITE_OTHER): Payer: BC Managed Care – PPO

## 2021-09-28 DIAGNOSIS — Z23 Encounter for immunization: Secondary | ICD-10-CM | POA: Diagnosis not present

## 2021-12-10 IMAGING — CT CT CARDIAC CORONARY ARTERY CALCIUM SCORE
3 series · 14 of 20 positions shown, 15 images · non-contrast
Comparison: None.
COMPARISON: None.

Addendum:
EXAM:
OVER-READ INTERPRETATION  CT CHEST

The following report is an over-read performed by radiologist Dr.
Blanche Berg [REDACTED] on 09/01/2019. This
over-read does not include interpretation of cardiac or coronary
anatomy or pathology. The coronary calcium score interpretation by
the cardiologist is attached.
CLINICAL DATA: Risk stratification
Coronary Calcium Score
TECHNIQUE: The patient was scanned on a Siemens Force scanner. Axial
non-contrast 3 mm slices were carried out through the heart. The
data set was analyzed on a dedicated work station and scored using
the Agatson method.

[Series 2: casc 3.0 bv41 2 bestdiast 70 % · axial · 0.39mm/px · z∈[-244,-162]mm · 4 of 47 slices shown, 5 images]
[im 10/47  vessel]
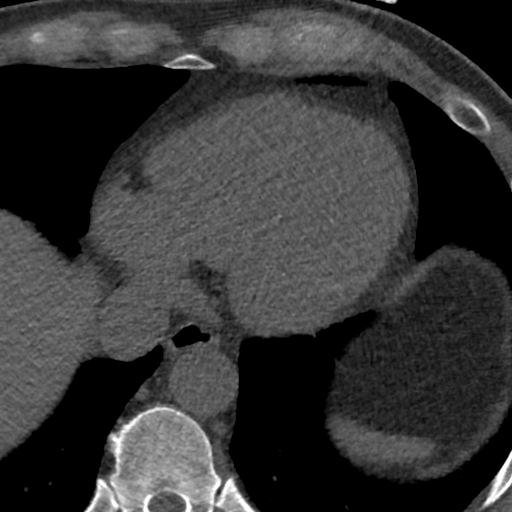
[im 10/47  lung]
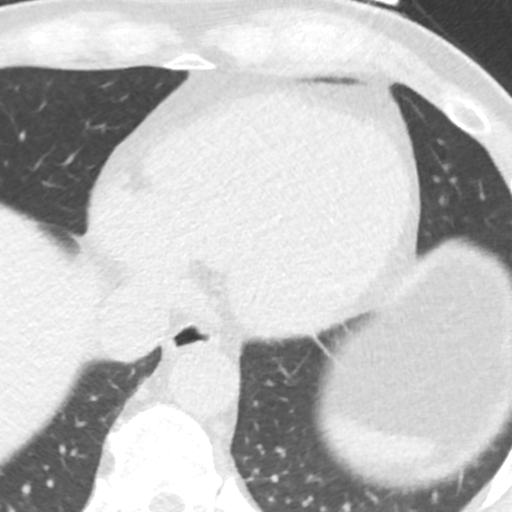
[im 19/47  vessel]
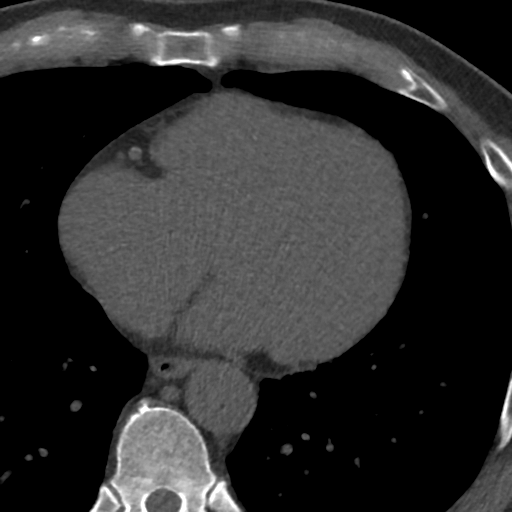
[im 28/47  vessel]
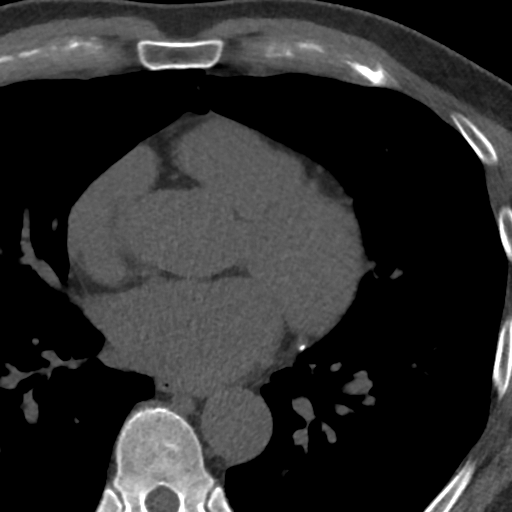
[im 37/47  vessel]
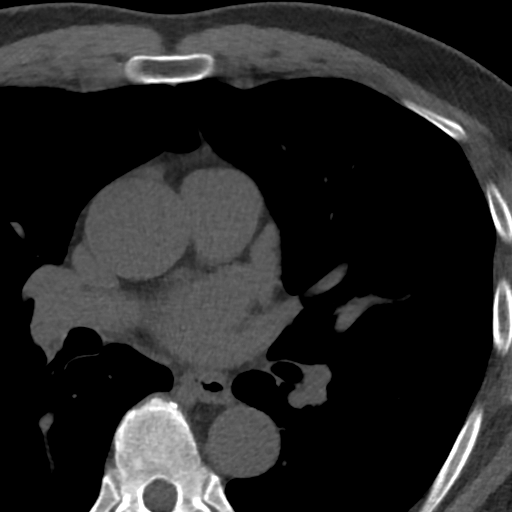

[Series 3: lung 72 % · axial · 0.68mm/px · z∈[-250,-156]mm · 5 of 47 slices shown]
[im 8/47  lung]
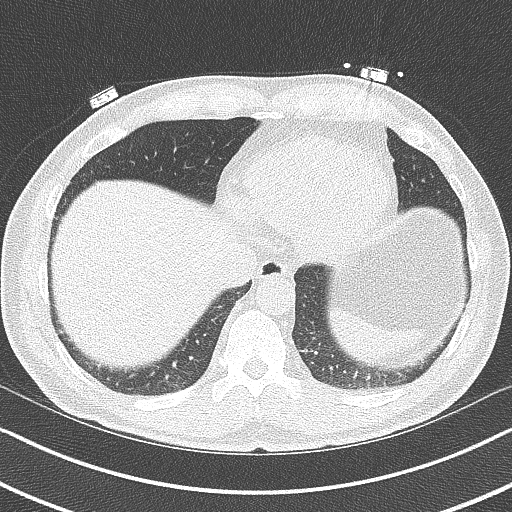
[im 16/47  lung]
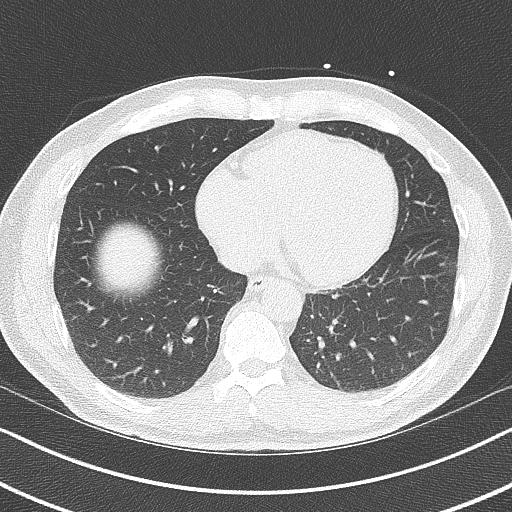
[im 24/47  lung]
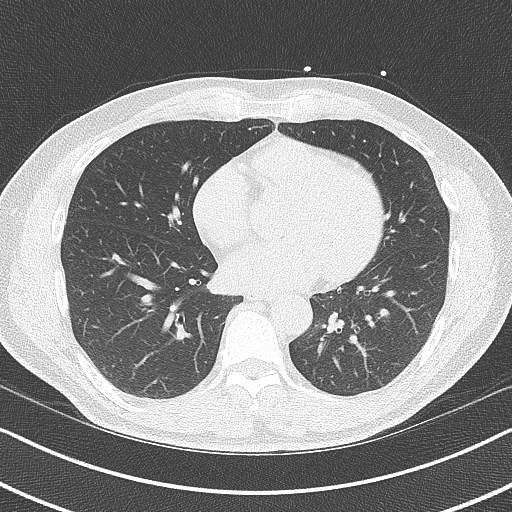
[im 31/47  lung]
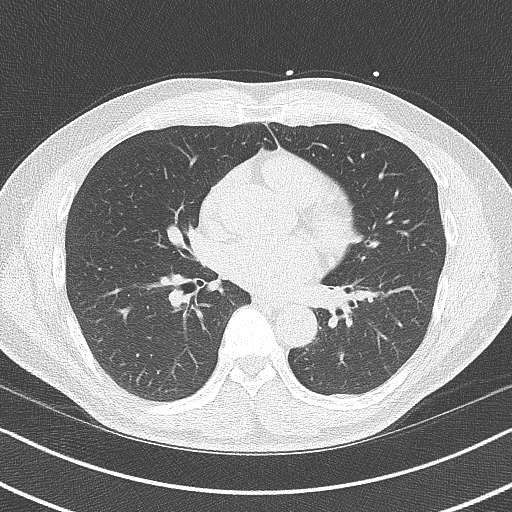
[im 39/47  lung]
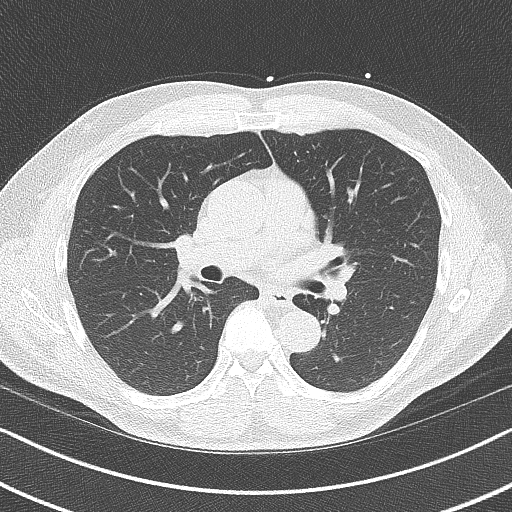

[Series 4: lung st 72 % · axial · 0.68mm/px · z∈[-250,-156]mm · 5 of 47 slices shown]
[im 8/47  lung]
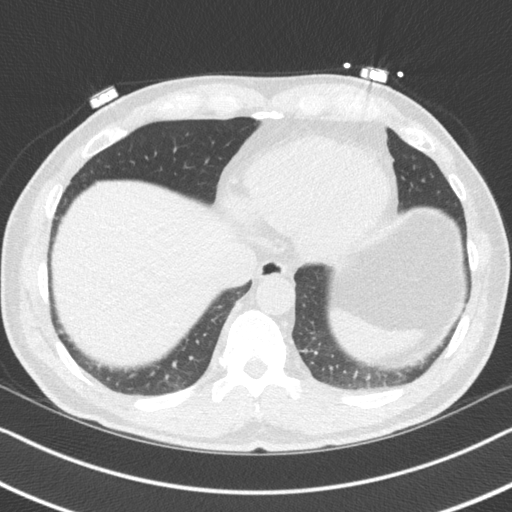
[im 16/47  lung]
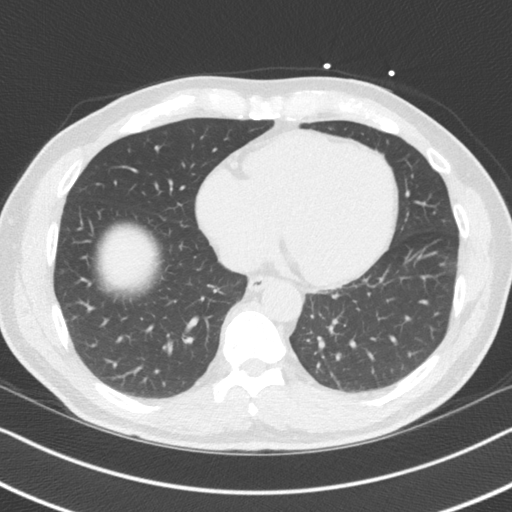
[im 24/47  lung]
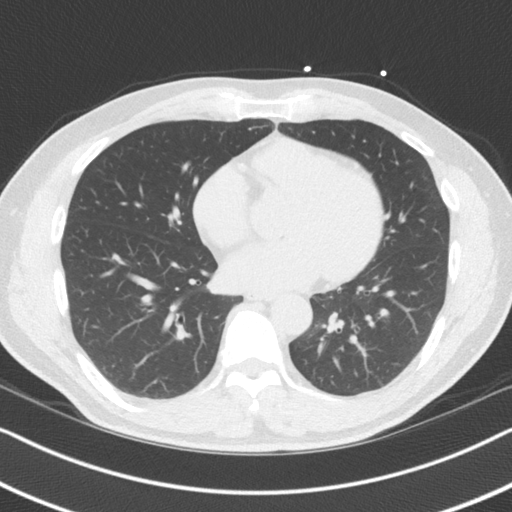
[im 31/47  lung]
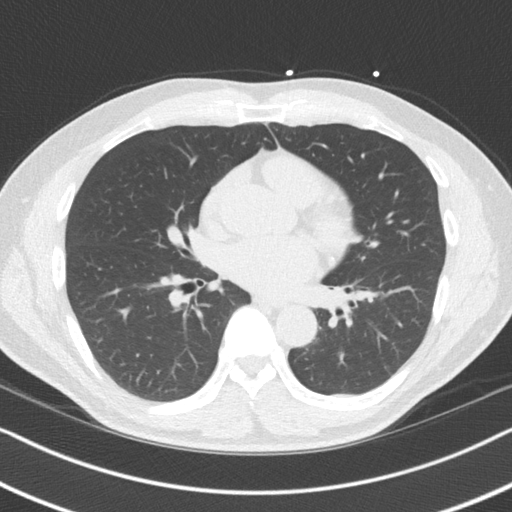
[im 39/47  lung]
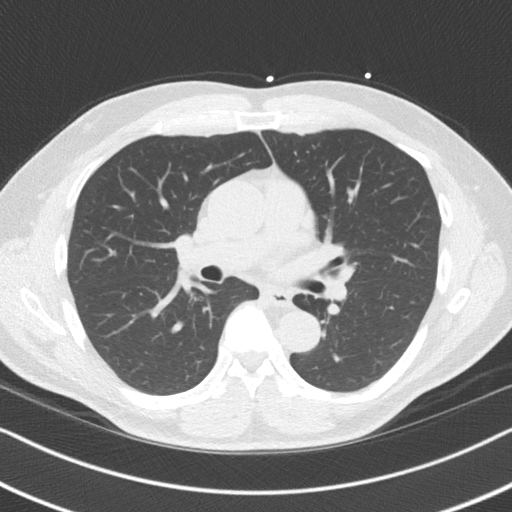

[14 of 20 positions shown; findings below may reference images not displayed]

FINDINGS: Multiple small pulmonary nodules are noted in the lungs bilaterally,
largest of which measures only 4 mm in the right lower lobe (axial
image 33 of series 3). Within the visualized portions of the thorax
there are no other larger more suspicious appearing pulmonary
nodules or masses, there is no acute consolidative airspace disease,
no pleural effusions, no pneumothorax and no lymphadenopathy.
Visualized portions of the upper abdomen demonstrates a 9 mm
low-attenuation lesion in segment 8 of the liver, incompletely
characterized on today's non-contrast CT examination, but
statistically likely to represent a tiny cyst. There are no
aggressive appearing lytic or blastic lesions noted in the
visualized portions of the skeleton.
IMPRESSION: 1. Multiple small pulmonary nodules measuring 4 mm or less in size,
nonspecific, but statistically likely benign. No follow-up needed if
patient is low-risk (and has no known or suspected primary
neoplasm). Non-contrast chest CT can be considered in 12 months if
patient is high-risk. This recommendation follows the consensus
statement: Guidelines for Management of Incidental Pulmonary Nodules
Detected on CT Images: From the [HOSPITAL] 9173; Radiology
FINDINGS: Non-cardiac: See separate report from [REDACTED].

Ascending Aorta: Normal size. Ascending aorta 3.6 cm. No
calcification.

Pericardium: Normal

Coronary arteries: Normal coronary artery origins arising from the
right and left coronary cusps. Scattered calcification noted in all
three coronary distributions.
IMPRESSION: Coronary calcium score of 62.2. This was 58th percentile for age and
sex matched control.

Scattered calcification noted in all three coronary distributions.

*** End of Addendum ***
EXAM:
OVER-READ INTERPRETATION  CT CHEST

The following report is an over-read performed by radiologist Dr.
Blanche Berg [REDACTED] on 09/01/2019. This
over-read does not include interpretation of cardiac or coronary
anatomy or pathology. The coronary calcium score interpretation by
the cardiologist is attached.
FINDINGS: Multiple small pulmonary nodules are noted in the lungs bilaterally,
largest of which measures only 4 mm in the right lower lobe (axial
image 33 of series 3). Within the visualized portions of the thorax
there are no other larger more suspicious appearing pulmonary
nodules or masses, there is no acute consolidative airspace disease,
no pleural effusions, no pneumothorax and no lymphadenopathy.
Visualized portions of the upper abdomen demonstrates a 9 mm
low-attenuation lesion in segment 8 of the liver, incompletely
characterized on today's non-contrast CT examination, but
statistically likely to represent a tiny cyst. There are no
aggressive appearing lytic or blastic lesions noted in the
visualized portions of the skeleton.
IMPRESSION: 1. Multiple small pulmonary nodules measuring 4 mm or less in size,
nonspecific, but statistically likely benign. No follow-up needed if
patient is low-risk (and has no known or suspected primary
neoplasm). Non-contrast chest CT can be considered in 12 months if
patient is high-risk. This recommendation follows the consensus
statement: Guidelines for Management of Incidental Pulmonary Nodules
Detected on CT Images: From the [HOSPITAL] 9173; Radiology

## 2022-01-04 DIAGNOSIS — E119 Type 2 diabetes mellitus without complications: Secondary | ICD-10-CM | POA: Diagnosis not present

## 2022-01-04 DIAGNOSIS — D696 Thrombocytopenia, unspecified: Secondary | ICD-10-CM | POA: Diagnosis not present

## 2022-01-04 DIAGNOSIS — E785 Hyperlipidemia, unspecified: Secondary | ICD-10-CM | POA: Diagnosis not present

## 2022-07-04 DIAGNOSIS — L738 Other specified follicular disorders: Secondary | ICD-10-CM | POA: Diagnosis not present

## 2022-07-04 DIAGNOSIS — D2261 Melanocytic nevi of right upper limb, including shoulder: Secondary | ICD-10-CM | POA: Diagnosis not present

## 2022-07-04 DIAGNOSIS — L80 Vitiligo: Secondary | ICD-10-CM | POA: Diagnosis not present

## 2022-07-04 DIAGNOSIS — L821 Other seborrheic keratosis: Secondary | ICD-10-CM | POA: Diagnosis not present

## 2022-08-02 ENCOUNTER — Encounter (INDEPENDENT_AMBULATORY_CARE_PROVIDER_SITE_OTHER): Payer: Self-pay

## 2022-08-28 ENCOUNTER — Encounter: Payer: BC Managed Care – PPO | Admitting: Internal Medicine

## 2022-08-30 NOTE — Progress Notes (Signed)
Chief Complaint  Patient presents with   Annual Exam    HPI: Patient  Jake Delgado  67 y.o. comes in today for Preventive Health Care visit  Dr Talmage Nap for dm controlled and follow  Sees derm for vitiligo  Had episode  of days of feeling cold during  travel  and was better and no sob  now  was in travel and  less sleep. Felt bad for about 3 ays but now back to baseline able to exercise his biking  15 miles Stopped the rosuvastatin  had  cramps  and they got better    Health Maintenance  Topic Date Due   COVID-19 Vaccine (7 - 2023-24 season) 09/16/2022 (Originally 12/30/2021)   DTaP/Tdap/Td (2 - Td or Tdap) 01/14/2024   Colonoscopy  06/10/2029   INFLUENZA VACCINE  Completed   Hepatitis C Screening  Completed   HIV Screening  Completed   Zoster Vaccines- Shingrix  Completed   HPV VACCINES  Aged Out   Health Maintenance Review LIFESTYLE:  Exercise:  biking  Tobacco/ETS: n Alcohol: n Sugar beverages: Sleep:  7=8  hors better  Drug use: no HH of  Work:  ft  work     ROS:  GEN/ HEENT: No fever, significant weight changes sweats headaches vision problems hearing changes, CV/ PULM; No chest pain shortness of breath cough, syncope,edema  change in exercise tolerance. See above  GI /GU: No adominal pain, vomiting, change in bowel habits. No blood in the stool. No significant GU symptoms. Gets  urinary frequency with caffiene other  SKIN/HEME: ,no acute  new skin rashes suspicious lesions or bleeding. No lymphadenopathy, nodules, masses.  NEURO/ PSYCH:  No neurologic signs such as weakness numbness. No depression anxiety. IMM/ Allergy: No unusual infections.  Allergy .   REST of 12 system review negative except as per HPI   Past Medical History:  Diagnosis Date   Allergy    seasonal   H/O left knee surgery    MCL 2008 Dr. Despina Hick   History of exercise stress test 2015   exercise cardiopulmonary  test   History of varicella    Hypertension    Sleep apnea    was  tested-no issues found.   Vitiligo    when younger no treatment doesn't run in families.    Past Surgical History:  Procedure Laterality Date   CARDIOVASCULAR STRESS TEST     COLONOSCOPY  2011   dental implants     KNEE ARTHROSCOPY      Family History  Problem Relation Age of Onset   Hypertension Mother    Diabetes Mellitus II Mother    Heart disease Other        maternal uncle in his 68s   Colon cancer Neg Hx    Colon polyps Neg Hx    Esophageal cancer Neg Hx    Stomach cancer Neg Hx    Rectal cancer Neg Hx     Social History   Socioeconomic History   Marital status: Married    Spouse name: Not on file   Number of children: Not on file   Years of education: Not on file   Highest education level: Not on file  Occupational History   Occupation: Surveyor, minerals  Tobacco Use   Smoking status: Never   Smokeless tobacco: Never  Vaping Use   Vaping status: Never Used  Substance and Sexual Activity   Alcohol use: No   Drug use: No   Sexual activity:  Not on file  Other Topics Concern   Not on file  Social History Narrative   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   Give ETS FA   Vegetarian but  Pos dairly and sometimes meat   Social Determinants of Health   Financial Resource Strain: Not on file  Food Insecurity: Not on file  Transportation Needs: Not on file  Physical Activity: Sufficiently Active (08/31/2022)   Exercise Vital Sign    Days of Exercise per Week: 3 days    Minutes of Exercise per Session: 60 min  Stress: No Stress Concern Present (08/31/2022)   [employer] of Occupational Health - Occupational Stress Questionnaire    Feeling of Stress : Not at all  Social Connections: Socially Integrated (08/31/2022)   Social  Connection and Isolation Panel [NHANES]    Frequency of Communication with Friends and Family: More than three times a week    Frequency of Social Gatherings with Friends and Family: Once a week    Attends Religious Services: More than 4 times per year    Active Member of [employer] or Organizations: Yes    Attends Engineer, structural: More than 4 times per year    Marital Status: Married    Outpatient Medications Prior to Visit  Medication Sig Dispense Refill   levocetirizine (XYZAL) 5 MG tablet Take 5 mg by mouth every evening.     losartan (COZAAR) 50 MG tablet TAKE 1 TABLET BY MOUTH EVERY DAY 90 tablet 3   rosuvastatin (CRESTOR) 5 MG tablet Take 1 tablet (5 mg total) by mouth daily. 30 tablet 3   No facility-administered medications prior to visit.     EXAM:  BP 120/86 (BP Location: Left Arm, Patient Position: Sitting, Cuff Size: Normal)   Pulse (!) 53   Temp 97.8 F (36.6 C) (Oral)   Ht 5' 9.5" (1.765 m)   Wt 193 lb 3.2 oz (87.6 kg)   SpO2 96%   BMI 28.12 kg/m   Body mass index is 28.12 kg/m. Wt Readings from Last 3 Encounters:  08/31/22 193 lb 3.2 oz (87.6 kg)  08/24/21 195 lb 3.2 oz (88.5 kg)  11/09/20 182 lb (82.6 kg)    Physical Exam: Vital signs reviewed KGM:WNUU is a well-developed well-nourished alert cooperative    who appearsr stated age in no acute distress.  HEENT: normocephalic atraumatic , Eyes: PERRL EOM's full, conjunctiva clear, Nares: paten,t no deformity discharge or tenderness., Ears: no deformity EAC's clear TMs with normal landmarks. Mouth: clear OP, no lesions, edema.  Moist mucous membranes. Dentition in adequate repair. NECK: supple without masses, thyromegaly or bruits. CHEST/PULM:  Clear to auscultation and percussion breath sounds equal no wheeze , rales or rhonchi. CV: PMI is nondisplaced, S1 S2 no gallops, murmurs, rubs. Peripheral pulses are full without delay.No JVD .  ABDOMEN: Bowel sounds normal nontender  No guard or rebound, no  hepato splenomegal no CVA tenderness.  Extremtities:  No clubbing cyanosis or edema, no acute joint swelling or redness no focal atrophy NEURO:  Oriented x3, cranial nerves 3-12 appear to be intact, no obvious focal weakness,gait within normal limits no abnormal reflexes or asymmetrical SKIN: No acute rashes normal turgor, c no bruising  or petechiae. Pale vitiligo findings  PSYCH: Oriented, good eye contact, no obvious depression anxiety, cognition and judgment appear normal. LN: no cervical axillary inguinal adenopathy Prostaterectal  nl tone prostate 1-2+ no nodules nl   Lab Results  Component Value Date   WBC 5.0 08/31/2022   HGB 15.2 08/31/2022   HCT 48.2 08/31/2022   PLT 203.0 08/31/2022   GLUCOSE 81 08/31/2022   CHOL 115 08/31/2022   TRIG 114.0 08/31/2022   HDL 34.50 (L) 08/31/2022   LDLCALC 58 08/31/2022   ALT 16 08/31/2022   AST 15 08/31/2022   NA 138 08/31/2022   K 4.5 08/31/2022   CL 99 08/31/2022   CREATININE 1.02 08/31/2022   BUN 20 08/31/2022   CO2 31 08/31/2022   TSH 0.47 08/31/2022   PSA 1.58 08/31/2022   HGBA1C 6.1 08/24/2021   MICROALBUR 0.7 08/24/2021    BP Readings from Last 3 Encounters:  08/31/22 120/86  08/24/21 (!) 138/100  11/09/20 130/80  Cardiac scoring 62  58%ile 2021   Lab plan  reviewed with patient   ASSESSMENT AND PLAN:  Discussed the following assessment and plan:    ICD-10-CM   1. Visit for preventive health examination  Z00.00 losartan (COZAAR) 50 MG tablet    CBC with Differential/Platelet    Comprehensive metabolic panel    Lipid panel    PSA    TSH    Lipoprotein A (LPA)    2. Essential hypertension  I10 losartan (COZAAR) 50 MG tablet    CBC with Differential/Platelet    Comprehensive metabolic panel    Lipid panel    PSA    TSH    Lipoprotein A (LPA)    3. Vitiligo  L80 losartan (COZAAR) 50 MG tablet    CBC with Differential/Platelet    Comprehensive metabolic panel    Lipid panel    PSA    TSH    4. Medication  management  Z79.899 losartan (COZAAR) 50 MG tablet    CBC with Differential/Platelet    Comprehensive metabolic panel    Lipid panel    PSA    TSH    Lipoprotein A (LPA)    5. Family history of diabetes mellitus  Z83.3 losartan (COZAAR) 50 MG tablet    CBC with Differential/Platelet    Comprehensive metabolic panel    Lipid panel    PSA    TSH    Lipoprotein A (LPA)    6. Flu vaccine need  Z23 Flu vaccine trivalent PF, 6mos and older(Flulaval,Afluria,Fluarix,Fluzone)    7. Hyperlipidemia, unspecified hyperlipidemia type  E78.5 CBC with Differential/Platelet    Comprehensive metabolic panel    Lipid panel    TSH    Lipoprotein A (LPA)    8. Screening PSA (prostate specific antigen)  Z12.5 PSA    9. Side effect of medication  T88.7XXA    cramps  on crestor    Monitoring   Consider trail of pravastatin  to avoid se or even zetia if possible depending on levels . Lab ending  Return in 1 year (on 08/31/2023) for depending on results  and possible med fu .  Patient Care Team: Elle Vezina, Neta Mends, MD as PCP - General (Internal Medicine) Nyoka Cowden, MD as Consulting Physician (Pulmonary Disease) [place], Amy, MD as Consulting Physician (Dermatology) Patient Instructions  Good to see you today . Lab today . We should consider trying a different  statin cholesterol med  beside crestor and atorva   Poss pravastatin  and if not help consider zetia or other for primary prevnetion.  Exam is  fine today .   Neta Mends. Shawnae Leiva M.D.

## 2022-08-31 ENCOUNTER — Encounter: Payer: Self-pay | Admitting: Internal Medicine

## 2022-08-31 ENCOUNTER — Ambulatory Visit: Payer: BC Managed Care – PPO | Admitting: Internal Medicine

## 2022-08-31 VITALS — BP 120/86 | HR 53 | Temp 97.8°F | Ht 69.5 in | Wt 193.2 lb

## 2022-08-31 DIAGNOSIS — Z125 Encounter for screening for malignant neoplasm of prostate: Secondary | ICD-10-CM | POA: Diagnosis not present

## 2022-08-31 DIAGNOSIS — E785 Hyperlipidemia, unspecified: Secondary | ICD-10-CM | POA: Diagnosis not present

## 2022-08-31 DIAGNOSIS — L80 Vitiligo: Secondary | ICD-10-CM

## 2022-08-31 DIAGNOSIS — Z0000 Encounter for general adult medical examination without abnormal findings: Secondary | ICD-10-CM

## 2022-08-31 DIAGNOSIS — Z79899 Other long term (current) drug therapy: Secondary | ICD-10-CM

## 2022-08-31 DIAGNOSIS — Z23 Encounter for immunization: Secondary | ICD-10-CM | POA: Diagnosis not present

## 2022-08-31 DIAGNOSIS — I10 Essential (primary) hypertension: Secondary | ICD-10-CM

## 2022-08-31 DIAGNOSIS — T887XXA Unspecified adverse effect of drug or medicament, initial encounter: Secondary | ICD-10-CM

## 2022-08-31 DIAGNOSIS — Z833 Family history of diabetes mellitus: Secondary | ICD-10-CM | POA: Diagnosis not present

## 2022-08-31 LAB — CBC WITH DIFFERENTIAL/PLATELET
Basophils Absolute: 0 10*3/uL (ref 0.0–0.1)
Basophils Relative: 0.5 % (ref 0.0–3.0)
Eosinophils Absolute: 0.1 10*3/uL (ref 0.0–0.7)
Eosinophils Relative: 1.2 % (ref 0.0–5.0)
HCT: 48.2 % (ref 39.0–52.0)
Hemoglobin: 15.2 g/dL (ref 13.0–17.0)
Lymphocytes Relative: 32 % (ref 12.0–46.0)
Lymphs Abs: 1.6 10*3/uL (ref 0.7–4.0)
MCHC: 31.4 g/dL (ref 30.0–36.0)
MCV: 77.4 fl — ABNORMAL LOW (ref 78.0–100.0)
Monocytes Absolute: 0.5 10*3/uL (ref 0.1–1.0)
Monocytes Relative: 9.9 % (ref 3.0–12.0)
Neutro Abs: 2.8 10*3/uL (ref 1.4–7.7)
Neutrophils Relative %: 56.4 % (ref 43.0–77.0)
Platelets: 203 10*3/uL (ref 150.0–400.0)
RBC: 6.23 Mil/uL — ABNORMAL HIGH (ref 4.22–5.81)
RDW: 14.4 % (ref 11.5–15.5)
WBC: 5 10*3/uL (ref 4.0–10.5)

## 2022-08-31 LAB — LIPID PANEL
Cholesterol: 115 mg/dL (ref 0–200)
HDL: 34.5 mg/dL — ABNORMAL LOW (ref >39.00)
LDL Cholesterol: 58 mg/dL (ref 0–99)
NonHDL: 80.38
Total CHOL/HDL Ratio: 3
Triglycerides: 114 mg/dL (ref 0.0–149.0)
VLDL: 22.8 mg/dL (ref 0.0–40.0)

## 2022-08-31 LAB — COMPREHENSIVE METABOLIC PANEL
ALT: 16 U/L (ref 0–53)
AST: 15 U/L (ref 0–37)
Albumin: 3.8 g/dL (ref 3.5–5.2)
Alkaline Phosphatase: 77 U/L (ref 39–117)
BUN: 20 mg/dL (ref 6–23)
CO2: 31 meq/L (ref 19–32)
Calcium: 9.3 mg/dL (ref 8.4–10.5)
Chloride: 99 meq/L (ref 96–112)
Creatinine, Ser: 1.02 mg/dL (ref 0.40–1.50)
GFR: 77.53 mL/min (ref >60.00)
Glucose, Bld: 81 mg/dL (ref 70–99)
Potassium: 4.5 meq/L (ref 3.5–5.1)
Sodium: 138 mEq/L (ref 135–145)
Total Bilirubin: 0.7 mg/dL (ref 0.2–1.2)
Total Protein: 7 g/dL (ref 6.0–8.3)

## 2022-08-31 MED ORDER — LOSARTAN POTASSIUM 50 MG PO TABS: 50.0000 mg | ORAL_TABLET | Freq: Every day | ORAL | 3 refills | Status: DC

## 2022-08-31 NOTE — Patient Instructions (Signed)
Good to see you today . Lab today . We should consider trying a different  statin cholesterol med  beside crestor and atorva   Poss pravastatin and if not help consider zetia or other for primary prevnetion.  Exam is  fine today .

## 2022-09-01 ENCOUNTER — Encounter: Payer: Self-pay | Admitting: Internal Medicine

## 2022-09-01 LAB — TSH: TSH: 0.47 u[IU]/mL (ref 0.35–5.50)

## 2022-09-01 LAB — PSA: PSA: 1.58 ng/mL (ref 0.10–4.00)

## 2022-09-03 LAB — LIPOPROTEIN A (LPA): Lipoprotein (a): 42 nmol/L (ref <75)

## 2022-10-02 DIAGNOSIS — H2513 Age-related nuclear cataract, bilateral: Secondary | ICD-10-CM | POA: Diagnosis not present

## 2022-10-02 DIAGNOSIS — H04123 Dry eye syndrome of bilateral lacrimal glands: Secondary | ICD-10-CM | POA: Diagnosis not present

## 2022-10-02 DIAGNOSIS — H524 Presbyopia: Secondary | ICD-10-CM | POA: Diagnosis not present

## 2022-12-17 NOTE — Progress Notes (Signed)
Results stable  or in range   lipo a in optimal range

## 2023-01-19 ENCOUNTER — Other Ambulatory Visit (HOSPITAL_COMMUNITY): Payer: Self-pay | Admitting: Endocrinology

## 2023-01-19 ENCOUNTER — Other Ambulatory Visit (HOSPITAL_BASED_OUTPATIENT_CLINIC_OR_DEPARTMENT_OTHER): Payer: Self-pay | Admitting: Endocrinology

## 2023-01-19 DIAGNOSIS — E119 Type 2 diabetes mellitus without complications: Secondary | ICD-10-CM

## 2023-02-10 IMAGING — CT CT ABD-PELV W/O CM
2 of 4 series · 16 of 46 positions shown, 18 images · non-contrast
Comparison: Coronary calcium score 09/01/2019

CLINICAL DATA: Abdominal aortic aneurysm surveillance.

EXAM:
CT ABDOMEN AND PELVIS WITHOUT CONTRAST
TECHNIQUE: Multidetector CT imaging of the abdomen and pelvis was performed
following the standard protocol without IV contrast.

[Series 2: abd pel wo · axial · 0.76mm/px · z∈[-480,-54]mm · 13 of 93 slices shown, 15 images]
[im 4/93  soft-tissue]
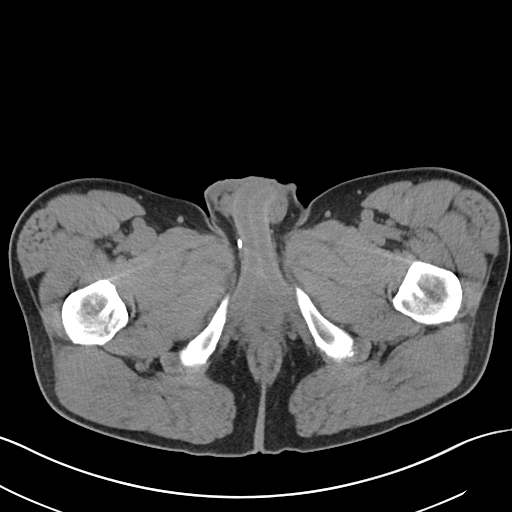
[im 4/93  bone]
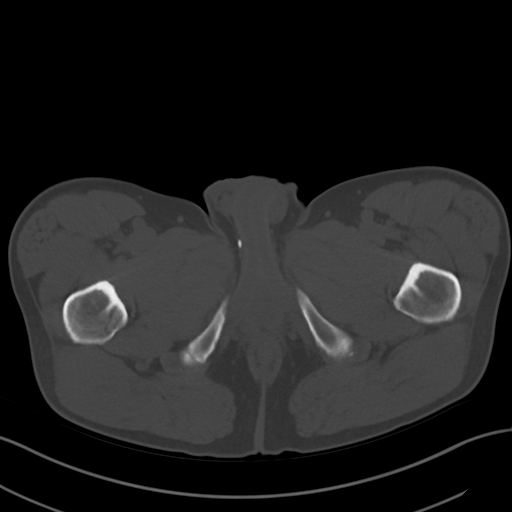
[im 11/93  soft-tissue]
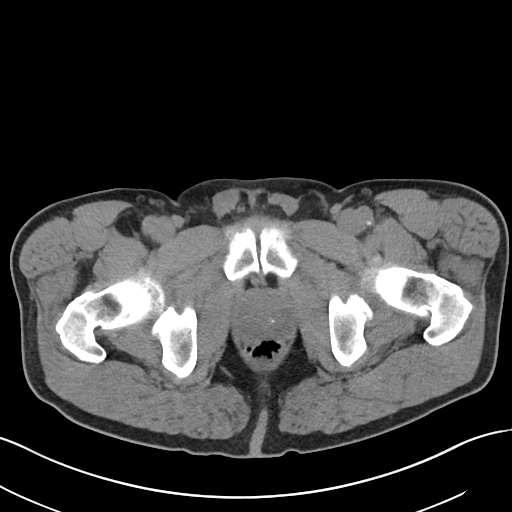
[im 18/93  soft-tissue]
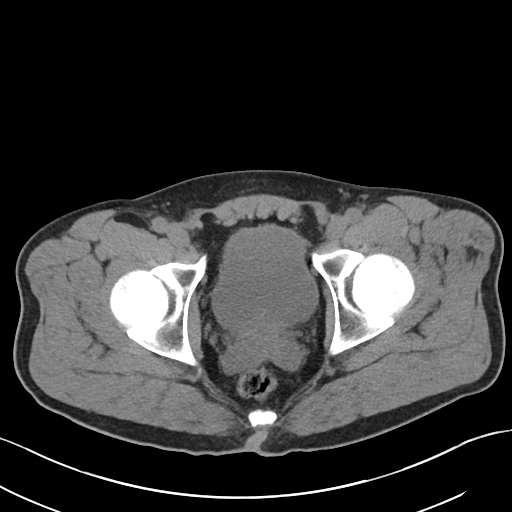
[im 25/93  soft-tissue]
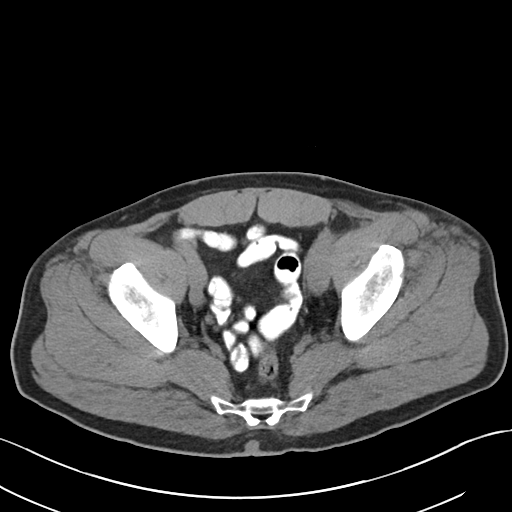
[im 32/93  soft-tissue]
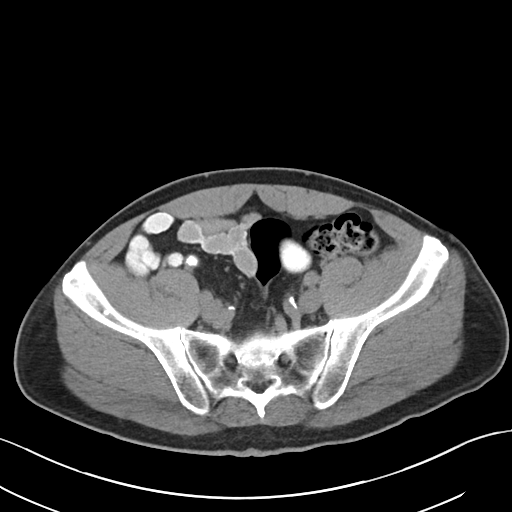
[im 39/93  soft-tissue]
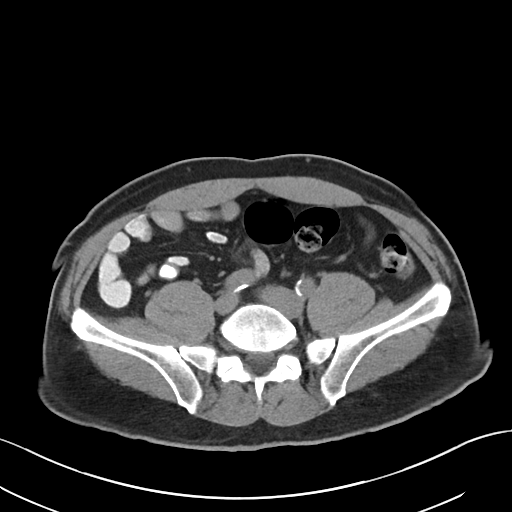
[im 47/93  soft-tissue]
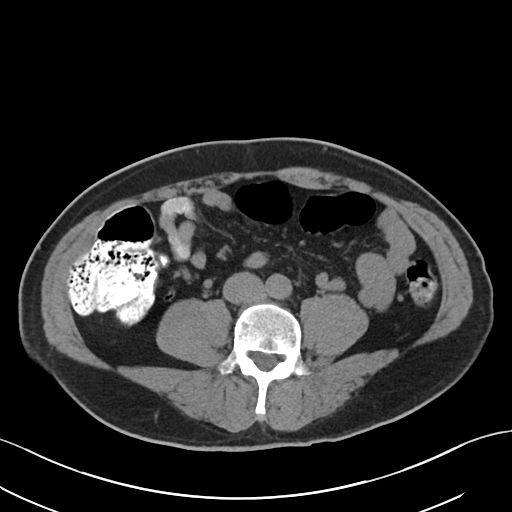
[im 54/93  soft-tissue]
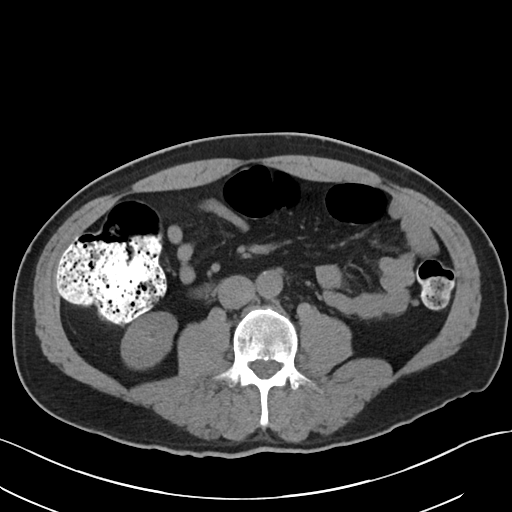
[im 61/93  soft-tissue]
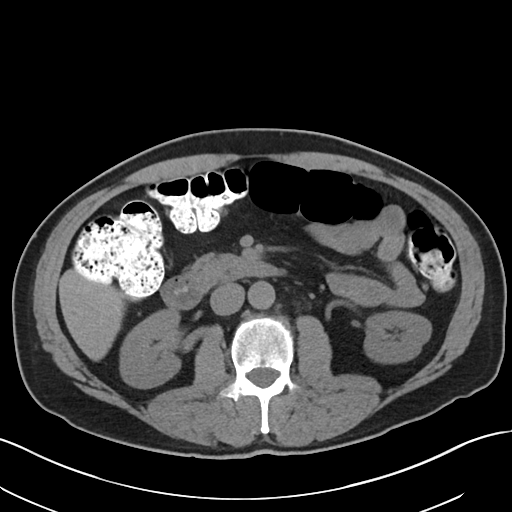
[im 61/93  bone]
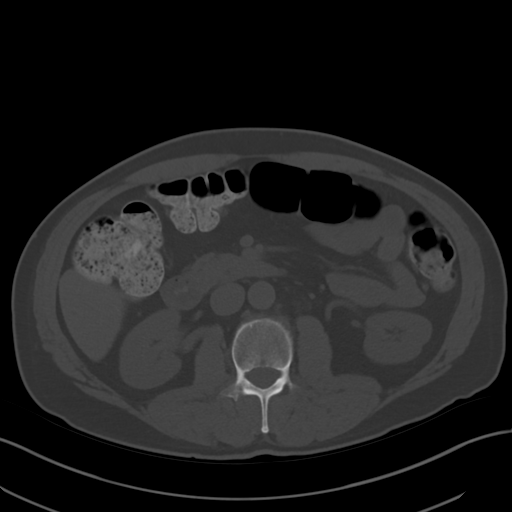
[im 68/93  soft-tissue]
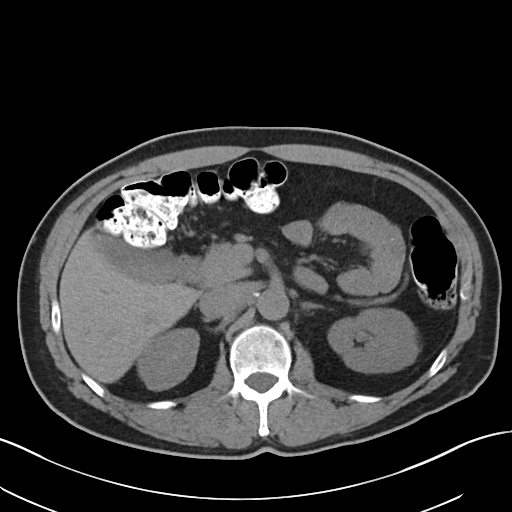
[im 75/93  soft-tissue]
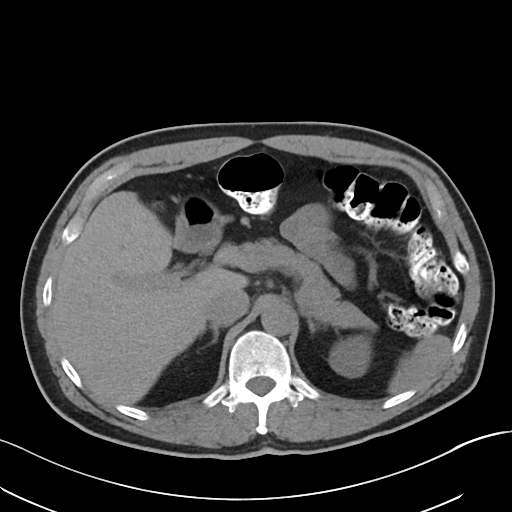
[im 82/93  soft-tissue]
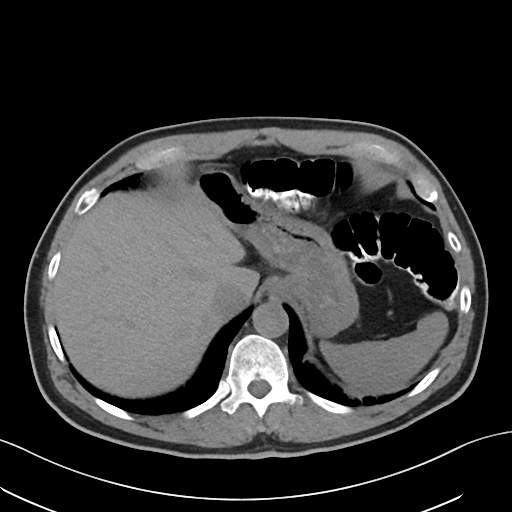
[im 89/93  soft-tissue]
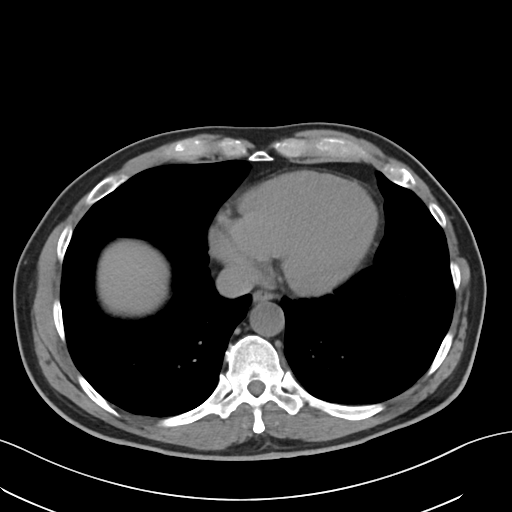

[Series 5: coronal · coronal · 0.75mm/px · 3 of 93 slices shown]
[im 31/93  soft-tissue]
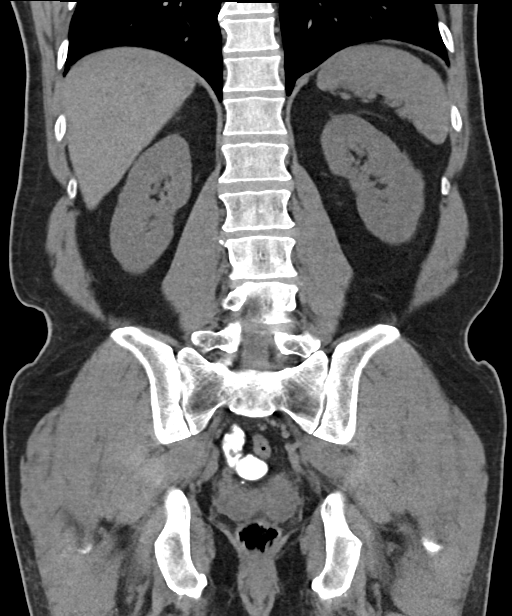
[im 41/93  soft-tissue]
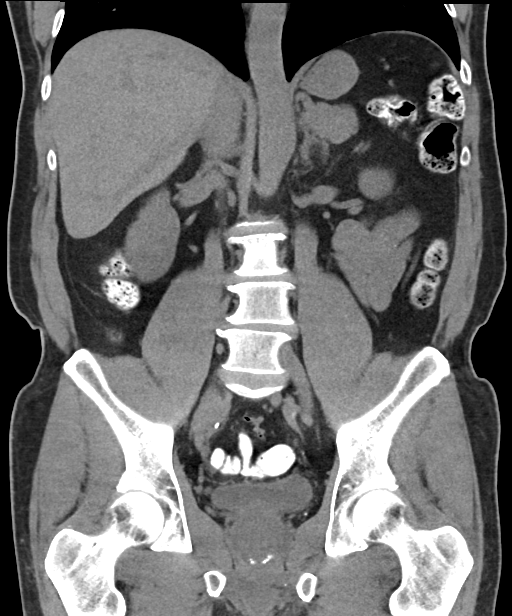
[im 52/93  soft-tissue]
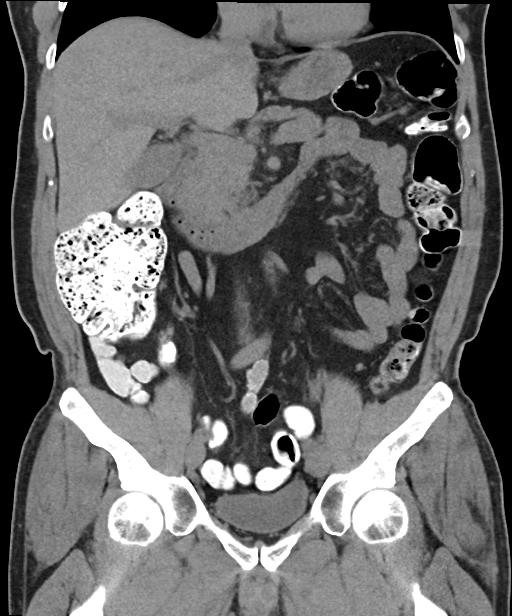

[16 of 46 positions shown; findings below may reference images not displayed]

FINDINGS: Lower chest: Again noted are several tiny nodules at the lung bases.
Findings are similar to the previous coronary calcium score. No
large pleural effusions.

Hepatobiliary: 5 mm low-density structure in the anterior right
hepatic lobe on sequence 2 image 11 is likely an incidental finding.
Normal appearance of the gallbladder.

Pancreas: Unremarkable. No pancreatic ductal dilatation or
surrounding inflammatory changes.

Spleen: Normal in size without focal abnormality.

Adrenals/Urinary Tract: Normal adrenal glands. Normal appearance of
the urinary bladder. Normal appearance of both kidneys without
stones or hydronephrosis. Low-density area in the right kidney lower
pole could represent a cyst but too small to definitively
characterize.

Stomach/Bowel: Stomach is within normal limits. Appendix appears
normal. No evidence of bowel wall thickening, distention, or
inflammatory changes.

Vascular/Lymphatic: Abdominal aorta near the hiatus measures 2.5 cm.
Infrarenal abdominal aorta measures 2.0 cm. Aortic and iliac wall
calcifications. Negative for an aortic aneurysm. Normal caliber of
the iliac arteries. No abdominal or pelvic lymph node enlargement.
Venous structures are unremarkable.

Reproductive: Prostate is enlarged measuring 5.0 cm in transverse
dimension with calcifications.

Other: Negative for free fluid.  Negative for free air.

Musculoskeletal: No acute bone abnormality.
IMPRESSION: 1. Negative for an abdominal aortic aneurysm.
2.  Aortic Atherosclerosis ([id number].X).
3. No acute abnormality in the abdomen or pelvis.
4. Prostate enlargement.

## 2023-02-23 ENCOUNTER — Ambulatory Visit (HOSPITAL_BASED_OUTPATIENT_CLINIC_OR_DEPARTMENT_OTHER)
Admission: RE | Admit: 2023-02-23 | Discharge: 2023-02-23 | Disposition: A | Discharge status: Home / Self Care | Payer: Self-pay | Source: Ambulatory Visit | Attending: Endocrinology | Admitting: Endocrinology

## 2023-02-23 DIAGNOSIS — E119 Type 2 diabetes mellitus without complications: Secondary | ICD-10-CM | POA: Insufficient documentation

## 2023-05-29 ENCOUNTER — Encounter: Payer: Self-pay | Admitting: Cardiovascular Disease

## 2023-05-29 ENCOUNTER — Ambulatory Visit: Attending: Cardiovascular Disease | Admitting: Cardiovascular Disease

## 2023-05-29 VITALS — BP 167/80 | HR 53 | Ht 70.0 in | Wt 204.6 lb

## 2023-05-29 DIAGNOSIS — I359 Nonrheumatic aortic valve disorder, unspecified: Secondary | ICD-10-CM | POA: Diagnosis present

## 2023-05-29 DIAGNOSIS — I77819 Aortic ectasia, unspecified site: Secondary | ICD-10-CM | POA: Diagnosis not present

## 2023-05-29 NOTE — Patient Instructions (Addendum)
 Medication Instructions:  Your physician recommends that you continue on your current medications as directed. Please refer to the Current Medication list given to you today.  *If you need a refill on your cardiac medications before your next appointment, please call your pharmacy*  Lab Work: None ordered.  If you have labs (blood work) drawn today and your tests are completely normal, you will receive your results only by: MyChart Message (if you have MyChart) OR A paper copy in the mail If you have any lab test that is abnormal or we need to change your treatment, we will call you to review the results.  Testing/Procedures: None ordered.   Follow-Up: At [facility], you and your health needs are our priority.  As part of our continuing mission to provide you with exceptional heart care, our providers are all part of one team.  This team includes your primary Cardiologist (physician) and Advanced Practice Providers or APPs (Physician Assistants and Nurse Practitioners) who all work together to provide you with the care you need, when you need it.  Your next appointment:   12 months with Dr Alvis Ba

## 2023-05-29 NOTE — Progress Notes (Signed)
 Cardiology Office Note:  .   Date:  05/29/2023  ID:  Saint Hank, DOB 29-Jun-1955, MRN [id number] PCP: Reginal Capra, MD  [facility] Providers Cardiologist:  None    History of Present Illness: .   Arjun Hard is a 68 y.o. male with dyslipidemia, prediabetes, hypertension and coronary artery calcification, referred in consultation by Dr. Ethel Henry.  He does not have a history of CAD or PAD but has incidentally discovered coronary artery calcifications on imaging studies.  He is physically active and denies angina or dyspnea at rest or with activity.  He has not had dizziness, palpitations or syncope.  He denies lower extremity edema, intermittent claudication, focal neurological deficits.  Screening for AAA has been negative.  In 2021 he had a coronary calcium  score of 62.2 (58th percentile).  In 2025 his coronary calcium  score had increased 249 (75th percentile).  In the past he was prescribed rosuvastatin  and atorvastatin  which both cause muscle cramps that resolved after discontinuation.  He prefers not to take statins.  However he has also made significant improvements in his metabolic profile.  He reports that the lipid profile performed in August 2024 was off all medications.  At that time total cholesterol was 115, HDL 34.5, LDL 58, triglycerides 114.  He has previously had normal LP(a).  He has just returned from a hiking trip in [facility] where he felt great without any cardiovascular complaints.  He did forget to take his losartan  with him and so has been off the medication for the previous 3 days.  Usually his blood pressure is in the 120s/80 range.  He eats a largely vegetarian diet although he occasionally eat meat if they are eating out.  He avoids starchy foods and sugary drinks.   Studies Reviewed: Aaron Aas   EKG Interpretation Date/Time:  Tuesday May 29 2023 13:37:57 EDT Ventricular Rate:  53 PR Interval:  200 QRS Duration:  92 QT Interval:  412 QTC  Calculation: 386 R Axis:   19  Text Interpretation: Sinus bradycardia Low voltage QRS When compared with ECG of 25-Jun-2006 10:45, No significant change was found Confirmed by Nissa Stannard ([id number]) on 05/29/2023 1:53:10 PM     Risk Assessment/Calculations:     HYPERTENSION CONTROL Vitals:   05/29/23 1331 05/29/23 1545  BP: (!) 162/82 (!) 167/80    The patient's blood pressure is elevated above target today.  In order to address the patient's elevated BP: Blood pressure will be monitored at home to determine if medication changes need to be made.          Physical Exam:   VS:  BP (!) 167/80   Pulse (!) 53   Ht 5\' 10"  (1.778 m)   Wt 92.8 kg   SpO2 96%   BMI 29.36 kg/m    Wt Readings from Last 3 Encounters:  05/29/23 92.8 kg  08/31/22 87.6 kg  08/24/21 88.5 kg    GEN: Well nourished, well developed in no acute distress NECK: No JVD; No carotid bruits CARDIAC: RRR, no murmurs, rubs, gallops RESPIRATORY:  Clear to auscultation without rales, wheezing or rhonchi  ABDOMEN: Soft, non-tender, non-distended EXTREMITIES:  No edema; No deformity   ASSESSMENT AND PLAN: .   Coronary calcification: His coronary calcium  score is only slightly higher than predicted for age and gender.  It is possible that the slight increase in percentile from 2021-2025 was related to treatment with statins and acceleration of plaque calcification.  He is asymptomatic.  He has  a pretty good family history.  On his paternal side everybody "lived to be greater than 59 years old and have never had heart disease", despite the fact that they have low HDL cholesterol. Dyslipidemia: Hard to believe we could make his LDL any better than 58 and improve his outlook.  I would focus on interventions that could improve his HDL cholesterol.  He has gained roughly 5 kg in the last year.  Reviewed his diet and the concept of the glycemic index.  Encouraged more physical activity and weight loss. Prediabetes: Sees Dr.  Ronelle Coffee.  Borderline hemoglobin A1c without medications.  Again weight loss and physical exercise are the best way to deal with this. HTN: He missed a few days of antihypertensive medication.  His blood pressure is little high today.  No changes were made.  Earlier this year in the same medicine his blood pressure was 122/80.       Dispo: Follow-up in 1 year.  Signed, Luana Rumple, MD

## 2023-09-04 NOTE — Progress Notes (Unsigned)
 No chief complaint on file.   HPI: Patient  Jake Delgado  68 y.o. comes in today for Preventive Health Care visit   Health Maintenance  Topic Date Due   Medicare Annual Wellness (AWV)  Never done   Pneumococcal Vaccine: 50+ Years (1 of 2 - PCV) Never done   Diabetic kidney evaluation - Urine ACR  05/01/2014   INFLUENZA VACCINE  08/03/2023   Diabetic kidney evaluation - eGFR measurement  08/31/2023   COVID-19 Vaccine (8 - Mixed Product risk 2024-25 season) 09/03/2023   DTaP/Tdap/Td (2 - Td or Tdap) 01/14/2024   Colonoscopy  06/10/2029   Hepatitis C Screening  Completed   HIV Screening  Completed   Zoster Vaccines- Shingrix   Completed   Hepatitis B Vaccines 19-59 Average Risk  Aged Out   HPV VACCINES  Aged Out   Meningococcal B Vaccine  Aged Out   Health Maintenance Review LIFESTYLE:  Exercise:   Tobacco/ETS: Alcohol:  Sugar beverages: Sleep: Drug use: no HH of  Work:    ROS:  GEN/ HEENT: No fever, significant weight changes sweats headaches vision problems hearing changes, CV/ PULM; No chest pain shortness of breath cough, syncope,edema  change in exercise tolerance. GI /GU: No adominal pain, vomiting, change in bowel habits. No blood in the stool. No significant GU symptoms. SKIN/HEME: ,no acute skin rashes suspicious lesions or bleeding. No lymphadenopathy, nodules, masses.  NEURO/ PSYCH:  No neurologic signs such as weakness numbness. No depression anxiety. IMM/ Allergy: No unusual infections.  Allergy .   REST of 12 system review negative except as per HPI   Past Medical History:  Diagnosis Date   Allergy    seasonal   H/O left knee surgery    MCL 2008 Dr. Hiram   History of exercise stress test 2015   exercise cardiopulmonary  test   History of varicella    Hypertension    Sleep apnea    was tested-no issues found.   Vitiligo    when younger no treatment doesn't run in families.    Past Surgical History:  Procedure Laterality Date    CARDIOVASCULAR STRESS TEST     COLONOSCOPY  2011   dental implants     KNEE ARTHROSCOPY      Family History  Problem Relation Age of Onset   Hypertension Mother    Diabetes Mellitus II Mother    Heart disease Other        maternal uncle in his 49s   Colon cancer Neg Hx    Colon polyps Neg Hx    Esophageal cancer Neg Hx    Stomach cancer Neg Hx    Rectal cancer Neg Hx     Social History   Socioeconomic History   Marital status: Married    Spouse name: Not on file   Number of children: Not on file   Years of education: Not on file   Highest education level: Master's degree (e.g., MA, MS, MEng, MEd, MSW, MBA)  Occupational History   Occupation: Surveyor, minerals  Tobacco Use   Smoking status: Never   Smokeless tobacco: Never  Vaping Use   Vaping status: Never Used  Substance and Sexual Activity   Alcohol use: No   Drug use: No   Sexual activity: Not on file  Other Topics Concern   Not on file  Social History Narrative   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   Give ETS FA   Vegetarian but  Pos dairly and sometimes meat   Social Drivers of Health   Financial Resource Strain: Low Risk  (09/04/2023)   Overall Financial Resource Strain (CARDIA)    Difficulty of Paying Living Expenses: Not hard at all  Food Insecurity: No Food Insecurity (09/04/2023)   Hunger Vital Sign    Worried About Running Out of Food in the Last Year: Never true    Ran Out of Food in the Last Year: Never true  Transportation Needs: No Transportation Needs (09/04/2023)   PRAPARE - Administrator, Civil Service (Medical): No    Lack of Transportation (Non-Medical): No  Physical Activity: Insufficiently Active (09/04/2023)   Exercise Vital Sign    Days of Exercise per  Week: 3 days    Minutes of Exercise per Session: 40 min  Stress: No Stress Concern Present (09/04/2023)   [employer] of Occupational Health - Occupational Stress Questionnaire    Feeling of Stress: Not at all  Social Connections: Unknown (09/04/2023)   Social Connection and Isolation Panel    Frequency of Communication with Friends and Family: Once a week    Frequency of Social Gatherings with Friends and Family: Patient declined    Attends Religious Services: 1 to 4 times per year    Active Member of [employer] or Organizations: Yes    Attends Banker Meetings: 1 to 4 times per year    Marital Status: Married    Outpatient Medications Prior to Visit  Medication Sig Dispense Refill   levocetirizine (XYZAL) 5 MG tablet Take 5 mg by mouth every evening.     losartan  (COZAAR ) 50 MG tablet Take 1 tablet (50 mg total) by mouth daily. 90 tablet 3   No facility-administered medications prior to visit.     EXAM:  There were no vitals taken for this visit.  There is no height or weight on file to calculate BMI. Wt Readings from Last 3 Encounters:  05/29/23 204 lb 9.6 oz (92.8 kg)  08/31/22 193 lb 3.2 oz (87.6 kg)  08/24/21 195 lb 3.2 oz (88.5 kg)    Physical Exam: Vital signs reviewed HZW:Uypd is a well-developed well-nourished alert cooperative    who appearsr stated age in no acute distress.  HEENT: normocephalic atraumatic , Eyes: PERRL EOM's full, conjunctiva clear, Nares: paten,t no deformity discharge or tenderness., Ears: no deformity EAC's clear TMs with normal landmarks. Mouth: clear OP, no lesions, edema.  Moist mucous membranes. Dentition in adequate repair. NECK: supple without masses, thyromegaly or bruits. CHEST/PULM:  Clear to auscultation and percussion breath sounds equal no wheeze , rales or rhonchi. No chest wall deformities or tenderness. Breast: normal by inspection . No dimpling, discharge, masses, tenderness or discharge . CV: PMI is nondisplaced, S1  S2 no gallops, murmurs, rubs. Peripheral pulses are full without delay.No JVD .  ABDOMEN: Bowel sounds normal nontender  No guard or rebound, no hepato splenomegal no CVA tenderness.  No hernia. Extremtities:  No clubbing cyanosis or edema, no acute joint swelling or redness no focal atrophy NEURO:  Oriented x3, cranial nerves 3-12 appear to be intact, no obvious focal weakness,gait within normal limits no abnormal reflexes or asymmetrical SKIN: No acute rashes normal turgor, color, no bruising or  petechiae. PSYCH: Oriented, good eye contact, no obvious depression anxiety, cognition and judgment appear normal. LN: no cervical axillary inguinal adenopathy  Lab Results  Component Value Date   WBC 5.0 08/31/2022   HGB 15.2 08/31/2022   HCT 48.2 08/31/2022   PLT 203.0 08/31/2022   GLUCOSE 81 08/31/2022   CHOL 115 08/31/2022   TRIG 114.0 08/31/2022   HDL 34.50 (L) 08/31/2022   LDLCALC 58 08/31/2022   ALT 16 08/31/2022   AST 15 08/31/2022   NA 138 08/31/2022   K 4.5 08/31/2022   CL 99 08/31/2022   CREATININE 1.02 08/31/2022   BUN 20 08/31/2022   CO2 31 08/31/2022   TSH 0.47 08/31/2022   PSA 1.58 08/31/2022   HGBA1C 6.1 08/24/2021    BP Readings from Last 3 Encounters:  05/29/23 (!) 167/80  08/31/22 120/86  08/24/21 (!) 138/100    Lab results reviewed with patient   ASSESSMENT AND PLAN:  Discussed the following assessment and plan:    ICD-10-CM   1. Visit for preventive health examination  Z00.00     2. Medication management  Z79.899      No follow-ups on file.  Patient Care Team: Breton Berns, [name], MD as PCP - General (Internal Medicine) Darlean Ozell NOVAK, MD as Consulting Physician (Pulmonary Disease) [place], Amy, MD as Consulting Physician (Dermatology) There are no Patient Instructions on file for this visit.  Pairlee Sawtell K. Cordera Stineman M.D.

## 2023-09-05 ENCOUNTER — Encounter: Payer: Self-pay | Admitting: Internal Medicine

## 2023-09-05 ENCOUNTER — Ambulatory Visit: Payer: BC Managed Care – PPO | Admitting: Internal Medicine

## 2023-09-05 ENCOUNTER — Ambulatory Visit: Payer: Self-pay | Admitting: Internal Medicine

## 2023-09-05 VITALS — BP 110/80 | HR 54 | Temp 97.9°F | Ht 69.5 in | Wt 191.2 lb

## 2023-09-05 DIAGNOSIS — Z833 Family history of diabetes mellitus: Secondary | ICD-10-CM

## 2023-09-05 DIAGNOSIS — Z23 Encounter for immunization: Secondary | ICD-10-CM

## 2023-09-05 DIAGNOSIS — I10 Essential (primary) hypertension: Secondary | ICD-10-CM | POA: Diagnosis not present

## 2023-09-05 DIAGNOSIS — L80 Vitiligo: Secondary | ICD-10-CM

## 2023-09-05 DIAGNOSIS — Z79899 Other long term (current) drug therapy: Secondary | ICD-10-CM

## 2023-09-05 DIAGNOSIS — Z125 Encounter for screening for malignant neoplasm of prostate: Secondary | ICD-10-CM

## 2023-09-05 DIAGNOSIS — E785 Hyperlipidemia, unspecified: Secondary | ICD-10-CM

## 2023-09-05 DIAGNOSIS — E119 Type 2 diabetes mellitus without complications: Secondary | ICD-10-CM | POA: Diagnosis not present

## 2023-09-05 DIAGNOSIS — Z0000 Encounter for general adult medical examination without abnormal findings: Secondary | ICD-10-CM

## 2023-09-05 LAB — HEMOGLOBIN A1C: Hgb A1c MFr Bld: 6.3 % (ref 4.6–6.5)

## 2023-09-05 LAB — LIPID PANEL
Cholesterol: 152 mg/dL (ref 0–200)
HDL: 49.6 mg/dL (ref >39.00)
LDL Cholesterol: 82 mg/dL (ref 0–99)
NonHDL: 102.08
Total CHOL/HDL Ratio: 3
Triglycerides: 102 mg/dL (ref 0.0–149.0)
VLDL: 20.4 mg/dL (ref 0.0–40.0)

## 2023-09-05 LAB — COMPREHENSIVE METABOLIC PANEL WITH GFR
ALT: 23 U/L (ref 0–53)
AST: 17 U/L (ref 0–37)
Albumin: 4.1 g/dL (ref 3.5–5.2)
Alkaline Phosphatase: 69 U/L (ref 39–117)
BUN: 21 mg/dL (ref 6–23)
CO2: 26 meq/L (ref 19–32)
Calcium: 8.8 mg/dL (ref 8.4–10.5)
Chloride: 104 meq/L (ref 96–112)
Creatinine, Ser: 0.87 mg/dL (ref 0.40–1.50)
GFR: 90.38 mL/min (ref >60.00)
Glucose, Bld: 85 mg/dL (ref 70–99)
Potassium: 4 meq/L (ref 3.5–5.1)
Sodium: 138 meq/L (ref 135–145)
Total Bilirubin: 1.1 mg/dL (ref 0.2–1.2)
Total Protein: 7 g/dL (ref 6.0–8.3)

## 2023-09-05 LAB — MICROALBUMIN / CREATININE URINE RATIO
Creatinine,U: 117.5 mg/dL
Microalb Creat Ratio: UNDETERMINED mg/g (ref 0.0–30.0)
Microalb, Ur: <0.7 mg/dL

## 2023-09-05 LAB — TSH: TSH: 1.69 u[IU]/mL (ref 0.35–5.50)

## 2023-09-05 LAB — PSA: PSA: 1.44 ng/mL (ref 0.10–4.00)

## 2023-09-05 MED ORDER — LOSARTAN POTASSIUM 50 MG PO TABS: 50.0000 mg | ORAL_TABLET | Freq: Every day | ORAL | 3 refills | Status: DC

## 2023-09-05 NOTE — Progress Notes (Signed)
 All results are in range   A1c I 6.3   higher than baseline  Continue lifestyle intervention healthy eating and exercise . And  check A1c in 6 mos or thereabouts

## 2023-09-05 NOTE — Patient Instructions (Addendum)
 Good to see you today  Refill med  Continue lifestyle intervention healthy eating and exercise .  Updated A1c and lipids  urine for protein etc today   If all ok then yearly check since you see Dr Tommas for other A1c follow.

## 2023-10-07 ENCOUNTER — Other Ambulatory Visit: Payer: Self-pay | Admitting: Internal Medicine

## 2023-10-07 DIAGNOSIS — I10 Essential (primary) hypertension: Secondary | ICD-10-CM

## 2023-10-07 DIAGNOSIS — L80 Vitiligo: Secondary | ICD-10-CM

## 2023-10-29 ENCOUNTER — Encounter: Payer: Self-pay | Admitting: Family Medicine

## 2023-10-29 ENCOUNTER — Ambulatory Visit: Admitting: Family Medicine

## 2023-10-29 ENCOUNTER — Ambulatory Visit: Payer: Self-pay

## 2023-10-29 VITALS — BP 126/84 | HR 55 | Temp 98.2°F | Ht 69.5 in | Wt 193.2 lb

## 2023-10-29 DIAGNOSIS — N5082 Scrotal pain: Secondary | ICD-10-CM

## 2023-10-29 DIAGNOSIS — R1013 Epigastric pain: Secondary | ICD-10-CM | POA: Diagnosis not present

## 2023-10-29 LAB — POC URINALSYSI DIPSTICK (AUTOMATED)
Bilirubin, UA: NEGATIVE
Blood, UA: NEGATIVE
Glucose, UA: NEGATIVE
Ketones, UA: NEGATIVE
Leukocytes, UA: NEGATIVE
Nitrite, UA: NEGATIVE
Protein, UA: NEGATIVE
Spec Grav, UA: 1.025 (ref 1.010–1.025)
Urobilinogen, UA: 0.2 U/dL
pH, UA: 6 (ref 5.0–8.0)

## 2023-10-29 LAB — CBC WITH DIFFERENTIAL/PLATELET
Basophils Absolute: 0 K/uL (ref 0.0–0.1)
Basophils Relative: 0.5 % (ref 0.0–3.0)
Eosinophils Absolute: 0.1 K/uL (ref 0.0–0.7)
Eosinophils Relative: 1.2 % (ref 0.0–5.0)
HCT: 45.9 % (ref 39.0–52.0)
Hemoglobin: 14.5 g/dL (ref 13.0–17.0)
Lymphocytes Relative: 24.4 % (ref 12.0–46.0)
Lymphs Abs: 1.3 K/uL (ref 0.7–4.0)
MCHC: 31.7 g/dL (ref 30.0–36.0)
MCV: 77.1 fl — ABNORMAL LOW (ref 78.0–100.0)
Monocytes Absolute: 0.6 K/uL (ref 0.1–1.0)
Monocytes Relative: 10.7 % (ref 3.0–12.0)
Neutro Abs: 3.2 K/uL (ref 1.4–7.7)
Neutrophils Relative %: 63.2 % (ref 43.0–77.0)
Platelets: 141 K/uL — ABNORMAL LOW (ref 150.0–400.0)
RBC: 5.96 Mil/uL — ABNORMAL HIGH (ref 4.22–5.81)
RDW: 15.1 % (ref 11.5–15.5)
WBC: 5.1 K/uL (ref 4.0–10.5)

## 2023-10-29 LAB — COMPREHENSIVE METABOLIC PANEL WITH GFR
ALT: 21 U/L (ref 0–53)
AST: 19 U/L (ref 0–37)
Albumin: 4.2 g/dL (ref 3.5–5.2)
Alkaline Phosphatase: 74 U/L (ref 39–117)
BUN: 23 mg/dL (ref 6–23)
CO2: 28 meq/L (ref 19–32)
Calcium: 9.1 mg/dL (ref 8.4–10.5)
Chloride: 103 meq/L (ref 96–112)
Creatinine, Ser: 0.88 mg/dL (ref 0.40–1.50)
GFR: 89.98 mL/min (ref >60.00)
Glucose, Bld: 98 mg/dL (ref 70–99)
Potassium: 4.2 meq/L (ref 3.5–5.1)
Sodium: 137 meq/L (ref 135–145)
Total Bilirubin: 0.7 mg/dL (ref 0.2–1.2)
Total Protein: 7.3 g/dL (ref 6.0–8.3)

## 2023-10-29 LAB — LIPASE: Lipase: 50 U/L (ref 11.0–59.0)

## 2023-10-29 NOTE — Telephone Encounter (Signed)
 FYI Only or Action Required?: FYI only for provider.  Patient was last seen in primary care on 09/05/2023 by [name], [name], MD.  Called Nurse Triage reporting Testicle Pain and Abdominal Pain.  Symptoms began 2 weeks.  Interventions attempted: Ice/heat application.  Symptoms are: gradually worsening.  Triage Disposition: See PCP When Office is Open (Within 3 Days)  Patient/caregiver understands and will follow disposition?: Yes                           Copied from CRM [id number]. Topic: Clinical - Red Word Triage >> Oct 29, 2023  9:34 AM Antony RAMAN wrote: Red Word that prompted transfer to Nurse Triage: pain behind right testicle Reason for Disposition  [1] Brief pain in scrotum or testicle AND [2] present < 1 hour AND [3] recurrent  (NO swelling)  Answer Assessment - Initial Assessment Questions 1. LOCATION and RADIATION: Where is the pain located?      Pain behind right testicle  2. QUALITY: What does the pain feel like?  (e.g., sharp, dull, aching, burning)     Lingering dull pain 3. SEVERITY: How bad is the pain?  (Scale 1-10; or mild, moderate, severe)   - MILD (1-3): doesn't interfere with normal activities    - MODERATE (4-7): interferes with normal activities (e.g., work or school) or awakens from sleep   - SEVERE (8-10): excruciating pain, unable to do any normal activities, difficulty walking     Rates pain a 2 at this time, states pain is just there, states pain does not prevent him from doing anything 4. ONSET: When did the pain start?     2 weeks ago  5. PATTERN: Does it come and go, or has it been constant since it started?     Comes and goes, states he first noticed symptoms at end of July and mentioned it felt like he pulled a muscle to his PCP 6. SCROTAL APPEARANCE: What does the scrotum look like? Is there any swelling or redness?      Denies swelling, denies redness 7. HERNIA: Has a doctor ever told you that you  have a hernia?     Denies 8. OTHER SYMPTOMS: Do you have any other symptoms? (e.g., fever, abdominal pain, vomiting, difficulty passing urine)     Abdominal discomfort after eating a heavy meal, reports normal bowel movements and urination, denies fever, denies painful urination, vomiting a few months ago, denies vomiting within past month  Protocols used: Scrotal Pain-A-AH

## 2023-10-29 NOTE — Telephone Encounter (Signed)
 FYI Pt was seen today with Dr. Mercer.

## 2023-10-29 NOTE — Progress Notes (Signed)
 Established Patient Office Visit   Subjective  Patient ID: Jake Delgado, male    DOB: 1955/12/26  Age: 68 y.o. MRN: [id number]  Chief Complaint  Patient presents with   Acute Visit    Patient came in today for Abdominal pain after a meal, with shooting pain to right testicle, started 2 months ago     Pt is a 68 yo male followed by Dr. Charlett who presents with ongoing epigastric pain. Started two months, describing it as a sensation of food 'sitting there' after meals. This discomfort began after a dancing event in [place] at the end of July, where he felt a 'twinge' during a stretch. Pain initially accompanied by vomiting for about a week, which resolved with rest and Tylenol .  Pt mentioned sx during CPE, but discomfort was not actively happening.  In recent weeks, the discomfort has persisted after eating and with progression to a dull pain that radiates to the area behind his right testicle. The pain is not severe but persistent and affects his mood. Warm compresses provide some relief. No pain during urination, bowel movements, or any changes in bowel habits. No dark or black stools, increased gas, or belching are reported.  He maintains a regular exercise routine, including biking and rowing, but has stopped rowing. He does not engage in heavy lifting and has not noticed any pain during physical activities. He denies any recent changes in medication or supplement use, and his only regular medications are losartan  and rarely Tylenol  as needed.  He has a history of constipation, which he attributes to family history, but reports regular bowel movements without straining. No heartburn, increased belching, or passing extra gas. He has not experienced any recent travel-related illnesses, although he recently traveled to [place] and plans to visit [place] next wk to see his father.  Will return Nov 10th.    Patient Active Problem List   Diagnosis Date Noted   Essential hypertension 08/04/2016    Family history of diabetes mellitus 01/18/2014   Elevated blood sugar 11/16/2013   Elevated blood pressure reading 11/16/2013   Vitiligo    Pulmonary infiltrates 07/09/2013   Past Medical History:  Diagnosis Date   Allergy 1986   seasonal   H/O left knee surgery    MCL 2008 Dr. Hiram   History of exercise stress test 2015   exercise cardiopulmonary  test   History of varicella    Hypertension    Sleep apnea    was tested-no issues found.   Vitiligo    when younger no treatment doesn't run in families.   Past Surgical History:  Procedure Laterality Date   CARDIOVASCULAR STRESS TEST     COLONOSCOPY  2011   dental implants     KNEE ARTHROSCOPY     Social History   Tobacco Use   Smoking status: Never   Smokeless tobacco: Never  Vaping Use   Vaping status: Never Used  Substance Use Topics   Alcohol use: No   Drug use: No   Family History  Problem Relation Age of Onset   Hypertension Mother    Diabetes Mellitus II Mother    Heart disease Other        maternal uncle in his 41s   Colon cancer Neg Hx    Colon polyps Neg Hx    Esophageal cancer Neg Hx    Stomach cancer Neg Hx    Rectal cancer Neg Hx    Allergies  Allergen Reactions   Ansaid [  Flurbiprofen] Other (See Comments)    Patients wife stated that he can not take any Ansaids   Aspirin Other (See Comments)   Dust Mite Extract     Other Reaction(s): Not available   Ibuprofen Other (See Comments)    REACTION: swelling neck and difficulty breathing   Atorvastatin  Other (See Comments)    MS side effects at 10 mg see notes 2023, muscle cramp    ROS Negative unless stated above    Objective:     BP 126/84 (BP Location: Left Arm, Patient Position: Sitting, Cuff Size: Large)   Pulse (!) 55   Temp 98.2 F (36.8 C) (Oral)   Ht 5' 9.5 (1.765 m)   Wt 193 lb 3.2 oz (87.6 kg)   SpO2 99%   BMI 28.12 kg/m  BP Readings from Last 3 Encounters:  10/29/23 126/84  09/05/23 110/80  05/29/23 (!) 167/80   Wt  Readings from Last 3 Encounters:  10/29/23 193 lb 3.2 oz (87.6 kg)  09/05/23 191 lb 3.2 oz (86.7 kg)  05/29/23 204 lb 9.6 oz (92.8 kg)      Physical Exam Constitutional:      General: He is not in acute distress.    Appearance: Normal appearance.  HENT:     Head: Normocephalic and atraumatic.     Nose: Nose normal.     Mouth/Throat:     Mouth: Mucous membranes are moist.  Cardiovascular:     Rate and Rhythm: Normal rate and regular rhythm.     Heart sounds: Normal heart sounds. No murmur heard.    No gallop.  Pulmonary:     Effort: Pulmonary effort is normal. No respiratory distress.     Breath sounds: Normal breath sounds. No wheezing, rhonchi or rales.  Abdominal:     General: Bowel sounds are normal.     Palpations: Abdomen is soft.     Tenderness: There is no abdominal tenderness. There is no guarding or rebound.     Hernia: No hernia is present.  Skin:    General: Skin is warm and dry.  Neurological:     Mental Status: He is alert and oriented to person, place, and time.        09/05/2023   10:48 AM 08/31/2022   11:42 AM 08/24/2021   10:11 AM  Depression screen PHQ 2/9  Decreased Interest 0 0 0  Down, Depressed, Hopeless 0 0 0  PHQ - 2 Score 0 0 0  Altered sleeping  0 0  Tired, decreased energy  0 0  Change in appetite  0 0  Feeling bad or failure about yourself   0 0  Trouble concentrating  0 0  Moving slowly or fidgety/restless  0 0  Suicidal thoughts  0 0  PHQ-9 Score  0 0      08/31/2022   11:43 AM  GAD 7 : Generalized Anxiety Score  Nervous, Anxious, on Edge 0  Control/stop worrying 0  Worry too much - different things 0  Trouble relaxing 0  Restless 0  Easily annoyed or irritable 0  Afraid - awful might happen 0  Total GAD 7 Score 0     Results for orders placed or performed in visit on 10/29/23  POCT Urinalysis Dipstick (Automated)  Result Value Ref Range   Color, UA yellow    Clarity, UA clear    Glucose, UA Negative Negative    Bilirubin, UA neg    Ketones, UA neg    Spec  Grav, UA 1.025 1.010 - 1.025   Blood, UA neg    pH, UA 6.0 5.0 - 8.0   Protein, UA Negative Negative   Urobilinogen, UA 0.2 0.2 or 1.0 E.U./dL   Nitrite, UA neg    Leukocytes, UA Negative Negative      Assessment & Plan:   Epigastric pain -     POCT Urinalysis Dipstick (Automated); Future -     Comprehensive metabolic panel with GFR; Future -     Lipase; Future -     D-dimer, quantitative; Future -     CBC with Differential/Platelet; Future -     CT ABDOMEN PELVIS WO CONTRAST; Future  Scrotal pain -     POCT Urinalysis Dipstick (Automated); Future -     CBC with Differential/Platelet; Future -     CT ABDOMEN PELVIS WO CONTRAST; Future   Ongoing epigastric pain x 2 months.  Less severe, but now with scrotal pain.  Discussed possible causes including pancreatitis, hiatal hernia, peptic ulcer, inguinal hernia, constipation.   POC UA negative.  Order labs. CT abd pelvis ordered.  Further recommendations such as referral based on results.  Given strict precautions.  Return if symptoms worsen or fail to improve.   Clotilda JONELLE Single, MD

## 2023-10-30 ENCOUNTER — Telehealth: Payer: Self-pay | Admitting: *Deleted

## 2023-10-30 ENCOUNTER — Ambulatory Visit
Admission: RE | Admit: 2023-10-30 | Discharge: 2023-10-30 | Disposition: A | Discharge status: Home / Self Care | Source: Ambulatory Visit | Attending: Family Medicine | Admitting: Family Medicine

## 2023-10-30 ENCOUNTER — Encounter: Payer: Self-pay | Admitting: Internal Medicine

## 2023-10-30 DIAGNOSIS — N5082 Scrotal pain: Secondary | ICD-10-CM

## 2023-10-30 DIAGNOSIS — R1013 Epigastric pain: Secondary | ICD-10-CM

## 2023-10-30 DIAGNOSIS — R935 Abnormal findings on diagnostic imaging of other abdominal regions, including retroperitoneum: Secondary | ICD-10-CM

## 2023-10-30 LAB — D-DIMER, QUANTITATIVE: D-Dimer, Quant: 1.96 ug{FEU}/mL — ABNORMAL HIGH (ref <0.50)

## 2023-10-30 NOTE — Telephone Encounter (Signed)
 Copied from CRM #[id number]. Topic: General - Other >> Oct 30, 2023 12:01 PM Mesmerise C wrote: Reason for CRM: Channing from [facility] has a call report for Ct report and abdomen there's pancreatic head enlargement acute pacreatitis versus underlying neoplasm their considerations recommends in abdomen with our with contrast if has follow up questions can be reached at [phone]

## 2023-10-31 NOTE — Telephone Encounter (Signed)
 order MRI abdomen with and without contrast as per  radiology  report for abnormal pancreatic head enlargement  Please do asap  ( see patient call ) Fist aviable in his time frame.   Also order   Recommend Doppler ultrasound or contrast-enhanced CT/MR venography to check for portal vein thrombosis  as per   ct report  Which ever can be done quickest

## 2023-10-31 NOTE — Telephone Encounter (Signed)
 Orders are placed and sent to STAT team to contact pt.

## 2023-10-31 NOTE — Addendum Note (Signed)
 Addended by: Gailene Youkhana on: 10/31/2023 04:00 PM   Modules accepted: Orders

## 2023-11-01 ENCOUNTER — Telehealth: Payer: Self-pay | Admitting: Family Medicine

## 2023-11-01 ENCOUNTER — Encounter (HOSPITAL_COMMUNITY): Payer: Self-pay | Admitting: Family Medicine

## 2023-11-01 ENCOUNTER — Ambulatory Visit: Payer: Self-pay | Admitting: Family Medicine

## 2023-11-01 ENCOUNTER — Emergency Department (HOSPITAL_COMMUNITY)

## 2023-11-01 ENCOUNTER — Ambulatory Visit: Admitting: Internal Medicine

## 2023-11-01 ENCOUNTER — Inpatient Hospital Stay (HOSPITAL_COMMUNITY)
Admission: EM | Admit: 2023-11-01 | Discharge: 2023-11-05 | DRG: 442 | Disposition: A | Discharge status: Home / Self Care | Attending: Internal Medicine | Admitting: Internal Medicine

## 2023-11-01 DIAGNOSIS — D751 Secondary polycythemia: Secondary | ICD-10-CM | POA: Diagnosis present

## 2023-11-01 DIAGNOSIS — G4730 Sleep apnea, unspecified: Secondary | ICD-10-CM | POA: Diagnosis present

## 2023-11-01 DIAGNOSIS — I81 Portal vein thrombosis: Principal | ICD-10-CM | POA: Diagnosis present

## 2023-11-01 DIAGNOSIS — Z888 Allergy status to other drugs, medicaments and biological substances status: Secondary | ICD-10-CM

## 2023-11-01 DIAGNOSIS — D6852 Prothrombin gene mutation: Secondary | ICD-10-CM | POA: Diagnosis present

## 2023-11-01 DIAGNOSIS — Z8249 Family history of ischemic heart disease and other diseases of the circulatory system: Secondary | ICD-10-CM

## 2023-11-01 DIAGNOSIS — Z9109 Other allergy status, other than to drugs and biological substances: Secondary | ICD-10-CM

## 2023-11-01 DIAGNOSIS — D6851 Activated protein C resistance: Secondary | ICD-10-CM | POA: Diagnosis present

## 2023-11-01 DIAGNOSIS — Z833 Family history of diabetes mellitus: Secondary | ICD-10-CM

## 2023-11-01 DIAGNOSIS — I10 Essential (primary) hypertension: Secondary | ICD-10-CM | POA: Diagnosis present

## 2023-11-01 DIAGNOSIS — Z886 Allergy status to analgesic agent status: Secondary | ICD-10-CM

## 2023-11-01 DIAGNOSIS — D696 Thrombocytopenia, unspecified: Secondary | ICD-10-CM | POA: Diagnosis present

## 2023-11-01 DIAGNOSIS — L80 Vitiligo: Secondary | ICD-10-CM | POA: Diagnosis present

## 2023-11-01 LAB — COMPREHENSIVE METABOLIC PANEL WITH GFR
ALT: 23 U/L (ref 0–44)
AST: 28 U/L (ref 15–41)
Albumin: 4.2 g/dL (ref 3.5–5.0)
Alkaline Phosphatase: 94 U/L (ref 38–126)
Anion gap: 12 (ref 5–15)
BUN: 24 mg/dL — ABNORMAL HIGH (ref 8–23)
CO2: 24 mmol/L (ref 22–32)
Calcium: 9.3 mg/dL (ref 8.9–10.3)
Chloride: 102 mmol/L (ref 98–111)
Creatinine, Ser: 0.96 mg/dL (ref 0.61–1.24)
GFR, Estimated: >60 mL/min (ref >60)
Glucose, Bld: 104 mg/dL — ABNORMAL HIGH (ref 70–99)
Potassium: 4.3 mmol/L (ref 3.5–5.1)
Sodium: 138 mmol/L (ref 135–145)
Total Bilirubin: 0.7 mg/dL (ref 0.0–1.2)
Total Protein: 7.2 g/dL (ref 6.5–8.1)

## 2023-11-01 LAB — CBC WITH DIFFERENTIAL/PLATELET
Abs Immature Granulocytes: 0.02 K/uL (ref 0.00–0.07)
Basophils Absolute: 0 K/uL (ref 0.0–0.1)
Basophils Relative: 1 %
Eosinophils Absolute: 0.1 K/uL (ref 0.0–0.5)
Eosinophils Relative: 1 %
HCT: 47.3 % (ref 39.0–52.0)
Hemoglobin: 15 g/dL (ref 13.0–17.0)
Immature Granulocytes: 0 %
Lymphocytes Relative: 25 %
Lymphs Abs: 1.5 K/uL (ref 0.7–4.0)
MCH: 24.4 pg — ABNORMAL LOW (ref 26.0–34.0)
MCHC: 31.7 g/dL (ref 30.0–36.0)
MCV: 76.8 fL — ABNORMAL LOW (ref 80.0–100.0)
Monocytes Absolute: 0.8 K/uL (ref 0.1–1.0)
Monocytes Relative: 13 %
Neutro Abs: 3.7 K/uL (ref 1.7–7.7)
Neutrophils Relative %: 60 %
Platelets: 150 K/uL (ref 150–400)
RBC: 6.16 MIL/uL — ABNORMAL HIGH (ref 4.22–5.81)
RDW: 14.7 % (ref 11.5–15.5)
WBC: 6.2 K/uL (ref 4.0–10.5)
nRBC: 0 % (ref 0.0–0.2)

## 2023-11-01 LAB — APTT: aPTT: <22 s — ABNORMAL LOW (ref 24–36)

## 2023-11-01 LAB — PROTIME-INR
INR: 1.1 (ref 0.8–1.2)
Prothrombin Time: 15 s (ref 11.4–15.2)

## 2023-11-01 LAB — SEDIMENTATION RATE: Sed Rate: 3 mm/h (ref 0–16)

## 2023-11-01 MED ORDER — SODIUM CHLORIDE 0.9% FLUSH
3.0000 mL | Freq: Two times a day (BID) | INTRAVENOUS | Status: DC
  Administered 2023-11-01 – 2023-11-05 (×5): 3 mL via INTRAVENOUS

## 2023-11-01 MED ORDER — HEPARIN BOLUS VIA INFUSION
4000.0000 [IU] | Freq: Once | INTRAVENOUS | Status: AC
  Administered 2023-11-01: 4000 [IU] via INTRAVENOUS
  Filled 2023-11-01: qty 4000

## 2023-11-01 MED ORDER — LOSARTAN POTASSIUM 50 MG PO TABS
50.0000 mg | ORAL_TABLET | Freq: Every day | ORAL | Status: DC
  Administered 2023-11-02 – 2023-11-05 (×4): 50 mg via ORAL
  Filled 2023-11-01 (×4): qty 1

## 2023-11-01 MED ORDER — PROCHLORPERAZINE EDISYLATE 10 MG/2ML IJ SOLN: 5.0000 mg | Freq: Four times a day (QID) | INTRAMUSCULAR | Status: DC | PRN

## 2023-11-01 MED ORDER — FENTANYL CITRATE (PF) 50 MCG/ML IJ SOSY: 12.5000 ug | PREFILLED_SYRINGE | INTRAMUSCULAR | Status: DC | PRN

## 2023-11-01 MED ORDER — HEPARIN (PORCINE) 25000 UT/250ML-% IV SOLN
1300.0000 [IU]/h | INTRAVENOUS | Status: DC
  Administered 2023-11-01 – 2023-11-04 (×5): 1300 [IU]/h via INTRAVENOUS
  Filled 2023-11-01 (×5): qty 250

## 2023-11-01 MED ORDER — ACETAMINOPHEN 325 MG PO TABS: 650.0000 mg | ORAL_TABLET | Freq: Four times a day (QID) | ORAL | Status: DC | PRN

## 2023-11-01 MED ORDER — ACETAMINOPHEN 650 MG RE SUPP: 650.0000 mg | Freq: Four times a day (QID) | RECTAL | Status: DC | PRN

## 2023-11-01 MED ORDER — SENNOSIDES-DOCUSATE SODIUM 8.6-50 MG PO TABS
1.0000 | ORAL_TABLET | Freq: Every evening | ORAL | Status: DC | PRN
Start: 2023-11-01 — End: 2023-11-05

## 2023-11-01 MED ORDER — GADOBUTROL 1 MMOL/ML IV SOLN
9.0000 mL | Freq: Once | INTRAVENOUS | Status: AC | PRN
  Administered 2023-11-01: 9 mL via INTRAVENOUS

## 2023-11-01 MED ORDER — OXYCODONE HCL 5 MG PO TABS: 5.0000 mg | ORAL_TABLET | ORAL | Status: DC | PRN

## 2023-11-01 NOTE — Telephone Encounter (Signed)
 Patient called to review labs and imaging results.  D-dimer elevated at 1.96.  CT abdomen pelvis on 10/30/2023 with pancreatic head enlargement concern for acute pancreatitis versus underlying neoplasm.  MRI of abdomen with and without contrast recommended.  An ill-defined area in the portal vein suspicious for portal vein thrombosis noted.  Also seen a small nonobstructive right renal calculus.  Given patient's symptoms and concern for thrombosis and patient's upcoming travel plans, patient advised to proceed to nearest ED for further evaluation and treatment.  Patient and his wife in agreeance.  Clotilda Single, MD

## 2023-11-01 NOTE — Progress Notes (Signed)
 PHARMACY - ANTICOAGULATION CONSULT NOTE  Pharmacy Consult for heparin Indication: portal vein thrombosis  Allergies  Allergen Reactions   Ansaid [Flurbiprofen] Other (See Comments)    Patients wife stated that he can not take any Ansaids   Aspirin Other (See Comments)   Dust Mite Extract     Other Reaction(s): Not available   Ibuprofen Other (See Comments)    REACTION: swelling neck and difficulty breathing   Atorvastatin  Other (See Comments)    MS side effects at 10 mg see notes 2023, muscle cramp    Patient Measurements: Height: 5' 9.5 (176.5 cm) Weight: 87.6 kg (193 lb 3.2 oz) IBW/kg (Calculated) : 71.85 HEPARIN DW (KG): 87.6  Vital Signs: Temp: 98 F (36.7 C) (10/30 1738) Temp Source: Oral (10/30 1738) BP: 141/88 (10/30 1738) Pulse Rate: 60 (10/30 1738)  Labs: Recent Labs    11/01/23 1713  HGB 15.0  HCT 47.3  PLT 150  CREATININE 0.96    Estimated Creatinine Clearance: 84.9 mL/min (by C-G formula based on SCr of 0.96 mg/dL).   Medical History: Past Medical History:  Diagnosis Date   Allergy 1986   seasonal   H/O left knee surgery    MCL 2008 Dr. Hiram   History of exercise stress test 2015   exercise cardiopulmonary  test   History of varicella    Hypertension    Sleep apnea    was tested-no issues found.   Vitiligo    when younger no treatment doesn't run in families.    Medications:  (Not in a hospital admission)  Scheduled:   Assessment: 69 YO male presenting with elevated D-dimer, imaging concerns for acute pancreatitis vs neoplasm and suspicious portal vein thrombosis. 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 Patient not on anticoagulation PTA, no history of bleeding upon chart review. Pharmacy consulted for heparin dosing.   Today, 11/01/23: Hgb 15--stable Plts 150--low, stable (~baseline) Scr 0.96--stable No s/sx of bleeding  reported  Goal of Therapy:  Heparin level 0.3-0.7 units/ml Monitor platelets by anticoagulation protocol: Yes   Plan:  Give heparin 4000 units IV x1 Start heparin infusion @1300  units/hr Check heparin level 6hrs after infusion starts Monitor heparin level, CBC, and s/sx of bleeding daily   Lacinda Moats, PharmD Clinical Pharmacist  10/30/20258:08 PM

## 2023-11-01 NOTE — H&P (Signed)
 History and Physical    [name]:[id number] DOB: [birth year] DOA: 11/01/2023  PCP: Charlett [name], MD   Patient coming from: Home   Chief Complaint: Abdominal discomfort, abnormal CT   HPI: Jake Delgado is a 68 y.o. male with medical history significant for vitiligo and hypertension who presents for evaluation of abdominal discomfort and abnormal outpatient CT.  Patient describes an episode on July 29 when he developed an acute pain in his abdomen while dancing which was followed by nausea and vomiting.  He was using warm compress and symptoms improved.    In recent weeks, he has developed a vague discomfort after eating but is no longer vomiting.  He was evaluated for the symptoms with an outpatient CT scan that was concerning for enlargement of the pancreatic head and findings concerning for possible portal vein thrombosis.    He denies any personal history of VTE and is unaware of any family history of clotting disorder though notes he [place] where these conditions may have gone undiagnosed.  ED Course: Upon arrival to the ED, patient is found to be afebrile and saturating well on room air with normal HR and elevated BP.  Labs are most notable for normal creatinine, normal WBC, normal hemoglobin, and normal LFTs.  MRI reveals extensive portal vein thrombosis without obvious cause.  IV heparin was ordered by the ED physician.  Review of Systems:  All other systems reviewed and apart from HPI, are negative.  Past Medical History:  Diagnosis Date   Allergy 1986   seasonal   H/O left knee surgery    MCL 2008 Dr. Hiram   History of exercise stress test 2015   exercise cardiopulmonary  test   History of varicella    Hypertension    Sleep apnea    was tested-no issues found.   Vitiligo    when younger no treatment doesn't run in families.    Past Surgical History:  Procedure Laterality Date   CARDIOVASCULAR STRESS TEST     COLONOSCOPY  2011    dental implants     KNEE ARTHROSCOPY      Social History:   reports that he has never smoked. He has never used smokeless tobacco. He reports that he does not drink alcohol and does not use drugs.  Allergies  Allergen Reactions   Ansaid [Flurbiprofen] Other (See Comments)    Patients wife stated that he can not take any Ansaids   Aspirin Other (See Comments)   Dust Mite Extract     Other Reaction(s): Not available   Ibuprofen Other (See Comments)    REACTION: swelling neck and difficulty breathing   Atorvastatin  Other (See Comments)    MS side effects at 10 mg see notes 2023, muscle cramp    Family History  Problem Relation Age of Onset   Hypertension Mother    Diabetes Mellitus II Mother    Heart disease Other        maternal uncle in his 13s   Colon cancer Neg Hx    Colon polyps Neg Hx    Esophageal cancer Neg Hx    Stomach cancer Neg Hx    Rectal cancer Neg Hx      Prior to Admission medications   Medication Sig Start Date End Date Taking? Authorizing Provider  levocetirizine (XYZAL) 5 MG tablet Take 5 mg by mouth every evening.    [provider]  losartan  (COZAAR ) 50 MG tablet TAKE 1 TABLET BY  MOUTH EVERY DAY 10/08/23   Charlett [name], MD    Physical Exam: Vitals:   11/01/23 1345 11/01/23 1347 11/01/23 1738 11/01/23 2000  BP:  (!) 164/109 (!) 141/88   Pulse: 65  60   Resp: 20  16   Temp: 99 F (37.2 C)  98 F (36.7 C)   TempSrc: Oral  Oral   SpO2:  98% 94%   Weight:    87.6 kg  Height:    5' 9.5 (1.765 m)    Constitutional: NAD, calm  Eyes: PERTLA, lids and conjunctivae normal ENMT: Mucous membranes are moist. Posterior pharynx clear of any exudate or lesions.   Neck: supple, no masses  Respiratory: no wheezing, no crackles. No accessory muscle use.  Cardiovascular: S1 & S2 heard, regular rate and rhythm. No extremity edema.   Abdomen: No distension, no tenderness, soft. Bowel sounds active.  Musculoskeletal: no clubbing / cyanosis. No  joint deformity upper and lower extremities.   Skin: no significant rashes, lesions, ulcers. Warm, dry, well-perfused. Neurologic: CN 2-12 grossly intact. Moving all extremmities. Alert and oriented.  Psychiatric: Pleasant. Cooperative.    Labs and Imaging on Admission: I have personally reviewed following labs and imaging studies  CBC: Recent Labs  Lab 10/29/23 1349 11/01/23 1713  WBC 5.1 6.2  NEUTROABS 3.2 3.7  HGB 14.5 15.0  HCT 45.9 47.3  MCV 77.1* 76.8*  PLT 141.0* 150   Basic Metabolic Panel: Recent Labs  Lab 10/29/23 1349 11/01/23 1713  NA 137 138  K 4.2 4.3  CL 103 102  CO2 28 24  GLUCOSE 98 104*  BUN 23 24*  CREATININE 0.88 0.96  CALCIUM  9.1 9.3   GFR: Estimated Creatinine Clearance: 84.9 mL/min (by C-G formula based on SCr of 0.96 mg/dL). Liver Function Tests: Recent Labs  Lab 10/29/23 1349 11/01/23 1713  AST 19 28  ALT 21 23  ALKPHOS 74 94  BILITOT 0.7 0.7  PROT 7.3 7.2  ALBUMIN 4.2 4.2   Recent Labs  Lab 10/29/23 1349  LIPASE 50.0   No results for input(s): AMMONIA in the last 168 hours. Coagulation Profile: No results for input(s): INR, PROTIME in the last 168 hours. Cardiac Enzymes: No results for input(s): CKTOTAL, CKMB, CKMBINDEX, TROPONINI in the last 168 hours. BNP (last 3 results) No results for input(s): PROBNP in the last 8760 hours. HbA1C: No results for input(s): HGBA1C in the last 72 hours. CBG: No results for input(s): GLUCAP in the last 168 hours. Lipid Profile: No results for input(s): CHOL, HDL, LDLCALC, TRIG, CHOLHDL, LDLDIRECT in the last 72 hours. Thyroid  Function Tests: No results for input(s): TSH, T4TOTAL, FREET4, T3FREE, THYROIDAB in the last 72 hours. Anemia Panel: No results for input(s): VITAMINB12, FOLATE, FERRITIN, TIBC, IRON, RETICCTPCT in the last 72 hours. Urine analysis:    Component Value Date/Time   COLORURINE YELLOW 06/07/2013 0022    APPEARANCEUR CLEAR 06/07/2013 0022   LABSPEC 1.021 06/07/2013 0022   PHURINE 6.0 06/07/2013 0022   GLUCOSEU NEGATIVE 06/07/2013 0022   HGBUR NEGATIVE 06/07/2013 0022   BILIRUBINUR neg 10/29/2023 1402   KETONESUR NEGATIVE 06/07/2013 0022   PROTEINUR Negative 10/29/2023 1402   PROTEINUR NEGATIVE 06/07/2013 0022   UROBILINOGEN 0.2 10/29/2023 1402   UROBILINOGEN 0.2 06/07/2013 0022   NITRITE neg 10/29/2023 1402   NITRITE NEGATIVE 06/07/2013 0022   LEUKOCYTESUR Negative 10/29/2023 1402   Sepsis Labs: @LABRCNTIP (procalcitonin:4,lacticidven:4) )No results found for this or any previous visit (from the past 240 hours).   Radiological Exams  on Admission: MR ABDOMEN MRCP W WO CONTAST Result Date: 11/01/2023 EXAM: MRCP WITHOUT AND WITH IV CONTRAST 11/01/2023 07:05:57 PM TECHNIQUE: Multisequence, multiplanar magnetic resonance images of the abdomen without and with intravenous contrast. MRCP sequences were performed. 9 mL Gadobutrol (GADAVIST) 1 MMOL/ML injection. COMPARISON: CT scan 10/30/2023 CLINICAL HISTORY: Epigastric abdominal pain  Abnormal CT scan. FINDINGS: LIVER: No hepatic lesions. No intrahepatic biliary dilatation. GALLBLADDER AND BILIARY SYSTEM: Gallbladder is unremarkable. No gallstones or findings for acute cholecystitis. Normal caliber and course of the common bile duct. No intrahepatic or extrahepatic ductal dilation. SPLEEN: The spleen is normal in size. No focal lesions. PANCREAS/PANCREATIC DUCT: The pancreas is unremarkable. No findings for acute pancreatitis. No mass or ductal dilatation. ADRENAL GLANDS: The adrenal glands are normal. KIDNEYS: No renal lesions or hydronephrosis. Small bilateral renal cysts not requiring any further imaging evaluation for follow-up. LYMPH NODES: No mesenteric or retroperitoneal mass or adenopathy. VASCULATURE: Fairly extensive portal vein thrombosis extending into the main portal vein. Periportal collateral vessels are noted. The aorta and branch  vessels are patent. No aneurysm or dissection. The major venous structures are patent except for the portal vein. PERITONEUM: No ascites. ABDOMINAL WALL: No abdominal wall hernia. No mass. BOWEL: The stomach, duodenum, visualized small bowel, and visualized colon are unremarkable. No bowel obstruction. BONES: The bony structures are unremarkable. SOFT TISSUES: Unremarkable. MISCELLANEOUS: The lung bases are clear. No pleural or pericardial effusion. No pulmonary infiltrates. IMPRESSION: 1. Extensive portal vein thrombosis extending into the main portal vein with periportal collateral vessels. No obvious cause. 2. No hepatic, bilirary or pancreatic abnormalities. Electronically signed by: Maude Stammer MD 11/01/2023 07:34 PM EDT RP Workstation: [id number]   MR 3D Recon At Scanner Result Date: 11/01/2023 EXAM: MRCP WITHOUT AND WITH IV CONTRAST 11/01/2023 07:05:57 PM TECHNIQUE: Multisequence, multiplanar magnetic resonance images of the abdomen without and with intravenous contrast. MRCP sequences were performed. 9 mL Gadobutrol (GADAVIST) 1 MMOL/ML injection. COMPARISON: CT scan 10/30/2023 CLINICAL HISTORY: Epigastric abdominal pain  Abnormal CT scan. FINDINGS: LIVER: No hepatic lesions. No intrahepatic biliary dilatation. GALLBLADDER AND BILIARY SYSTEM: Gallbladder is unremarkable. No gallstones or findings for acute cholecystitis. Normal caliber and course of the common bile duct. No intrahepatic or extrahepatic ductal dilation. SPLEEN: The spleen is normal in size. No focal lesions. PANCREAS/PANCREATIC DUCT: The pancreas is unremarkable. No findings for acute pancreatitis. No mass or ductal dilatation. ADRENAL GLANDS: The adrenal glands are normal. KIDNEYS: No renal lesions or hydronephrosis. Small bilateral renal cysts not requiring any further imaging evaluation for follow-up. LYMPH NODES: No mesenteric or retroperitoneal mass or adenopathy. VASCULATURE: Fairly extensive portal vein thrombosis extending  into the main portal vein. Periportal collateral vessels are noted. The aorta and branch vessels are patent. No aneurysm or dissection. The major venous structures are patent except for the portal vein. PERITONEUM: No ascites. ABDOMINAL WALL: No abdominal wall hernia. No mass. BOWEL: The stomach, duodenum, visualized small bowel, and visualized colon are unremarkable. No bowel obstruction. BONES: The bony structures are unremarkable. SOFT TISSUES: Unremarkable. MISCELLANEOUS: The lung bases are clear. No pleural or pericardial effusion. No pulmonary infiltrates. IMPRESSION: 1. Extensive portal vein thrombosis extending into the main portal vein with periportal collateral vessels. No obvious cause. 2. No hepatic, bilirary or pancreatic abnormalities. Electronically signed by: Maude Stammer MD 11/01/2023 07:34 PM EDT RP Workstation: [id number]    Assessment/Plan   1. Portal vein thrombosis  - Extensive thrombosis demonstrated on MRI; no hepatobiliary or pancreatic abnormalities on MRI and etiology is unclear  - IV  heparin started in ED  - Check coags, ESR, APC resistance, prothrombin mutation, protein C & S, antithrombin, AFP, and CA 19-9, continue IV heparin for now    2. Hypertension  - Losartan      DVT prophylaxis: IV heparin  Code Status: Full  Level of Care: Level of care: Med-Surg Family Communication: None present  Disposition Plan:  Patient is from: Home  Anticipated d/c is to: Home  Anticipated d/c date is: Possibly as early as 10/31 or 11/03/23  Patient currently: Pending additional lab workup, tolerance of anticoagulation  Consults called: None  Admission status: Observation     Evalene GORMAN Sprinkles, MD [facility]  11/01/2023, 8:43 PM

## 2023-11-01 NOTE — ED Provider Notes (Signed)
 [place] EMERGENCY DEPARTMENT AT [facility] Provider Note   CSN: [id number] Arrival date & time: 11/01/23  1325     Patient presents with: Abdominal Pain   Jake Delgado is a 68 y.o. male.  {Add pertinent medical, surgical, social history, OB history to YEP:67052} Patient has been having some abdominal discomfort after eating.  He was seen by his primary care and they did a CT scan that suggested getting an MRI.  Patient not having any pain now.  Patient has a history of hypertension   Abdominal Pain      Prior to Admission medications   Medication Sig Start Date End Date Taking? Authorizing Provider  levocetirizine (XYZAL) 5 MG tablet Take 5 mg by mouth every evening.    [provider]  losartan  (COZAAR ) 50 MG tablet TAKE 1 TABLET BY MOUTH EVERY DAY 10/08/23   [name], Wanda K, MD    Allergies: Ansaid [flurbiprofen], Aspirin, Dust mite extract, Ibuprofen, and Atorvastatin     Review of Systems  Gastrointestinal:  Positive for abdominal pain.    Updated Vital Signs BP (!) 141/88 (BP Location: Left Arm)   Pulse 60   Temp 98 F (36.7 C) (Oral)   Resp 16   Ht 5' 9.5 (1.765 m)   Wt 87.6 kg   SpO2 94%   BMI 28.12 kg/m   Physical Exam  (all labs ordered are listed, but only abnormal results are displayed) Labs Reviewed  CBC WITH DIFFERENTIAL/PLATELET - Abnormal; Notable for the following components:      Result Value   RBC 6.16 (*)    MCV 76.8 (*)    MCH 24.4 (*)    All other components within normal limits  COMPREHENSIVE METABOLIC PANEL WITH GFR - Abnormal; Notable for the following components:   Glucose, Bld 104 (*)    BUN 24 (*)    All other components within normal limits  APTT  PROTIME-INR  SEDIMENTATION RATE  AFP TUMOR MARKER  FACTOR 5 LEIDEN  PROTHROMBIN GENE MUTATION  PROTEIN C, TOTAL  PROTEIN S, TOTAL  ANTITHROMBIN III  CBC  HIV ANTIBODY (ROUTINE TESTING W REFLEX)  BASIC METABOLIC PANEL WITH GFR     EKG: None  Radiology: MR ABDOMEN MRCP W WO CONTAST Result Date: 11/01/2023 EXAM: MRCP WITHOUT AND WITH IV CONTRAST 11/01/2023 07:05:57 PM TECHNIQUE: Multisequence, multiplanar magnetic resonance images of the abdomen without and with intravenous contrast. MRCP sequences were performed. 9 mL Gadobutrol (GADAVIST) 1 MMOL/ML injection. COMPARISON: CT scan 10/30/2023 CLINICAL HISTORY: Epigastric abdominal pain  Abnormal CT scan. FINDINGS: LIVER: No hepatic lesions. No intrahepatic biliary dilatation. GALLBLADDER AND BILIARY SYSTEM: Gallbladder is unremarkable. No gallstones or findings for acute cholecystitis. Normal caliber and course of the common bile duct. No intrahepatic or extrahepatic ductal dilation. SPLEEN: The spleen is normal in size. No focal lesions. PANCREAS/PANCREATIC DUCT: The pancreas is unremarkable. No findings for acute pancreatitis. No mass or ductal dilatation. ADRENAL GLANDS: The adrenal glands are normal. KIDNEYS: No renal lesions or hydronephrosis. Small bilateral renal cysts not requiring any further imaging evaluation for follow-up. LYMPH NODES: No mesenteric or retroperitoneal mass or adenopathy. VASCULATURE: Fairly extensive portal vein thrombosis extending into the main portal vein. Periportal collateral vessels are noted. The aorta and branch vessels are patent. No aneurysm or dissection. The major venous structures are patent except for the portal vein. PERITONEUM: No ascites. ABDOMINAL WALL: No abdominal wall hernia. No mass. BOWEL: The stomach, duodenum, visualized small bowel, and visualized colon are unremarkable. No bowel obstruction.  BONES: The bony structures are unremarkable. SOFT TISSUES: Unremarkable. MISCELLANEOUS: The lung bases are clear. No pleural or pericardial effusion. No pulmonary infiltrates. IMPRESSION: 1. Extensive portal vein thrombosis extending into the main portal vein with periportal collateral vessels. No obvious cause. 2. No hepatic, bilirary or  pancreatic abnormalities. Electronically signed by: Maude Stammer MD 11/01/2023 07:34 PM EDT RP Workstation: [id number]   MR 3D Recon At Scanner Result Date: 11/01/2023 EXAM: MRCP WITHOUT AND WITH IV CONTRAST 11/01/2023 07:05:57 PM TECHNIQUE: Multisequence, multiplanar magnetic resonance images of the abdomen without and with intravenous contrast. MRCP sequences were performed. 9 mL Gadobutrol (GADAVIST) 1 MMOL/ML injection. COMPARISON: CT scan 10/30/2023 CLINICAL HISTORY: Epigastric abdominal pain  Abnormal CT scan. FINDINGS: LIVER: No hepatic lesions. No intrahepatic biliary dilatation. GALLBLADDER AND BILIARY SYSTEM: Gallbladder is unremarkable. No gallstones or findings for acute cholecystitis. Normal caliber and course of the common bile duct. No intrahepatic or extrahepatic ductal dilation. SPLEEN: The spleen is normal in size. No focal lesions. PANCREAS/PANCREATIC DUCT: The pancreas is unremarkable. No findings for acute pancreatitis. No mass or ductal dilatation. ADRENAL GLANDS: The adrenal glands are normal. KIDNEYS: No renal lesions or hydronephrosis. Small bilateral renal cysts not requiring any further imaging evaluation for follow-up. LYMPH NODES: No mesenteric or retroperitoneal mass or adenopathy. VASCULATURE: Fairly extensive portal vein thrombosis extending into the main portal vein. Periportal collateral vessels are noted. The aorta and branch vessels are patent. No aneurysm or dissection. The major venous structures are patent except for the portal vein. PERITONEUM: No ascites. ABDOMINAL WALL: No abdominal wall hernia. No mass. BOWEL: The stomach, duodenum, visualized small bowel, and visualized colon are unremarkable. No bowel obstruction. BONES: The bony structures are unremarkable. SOFT TISSUES: Unremarkable. MISCELLANEOUS: The lung bases are clear. No pleural or pericardial effusion. No pulmonary infiltrates. IMPRESSION: 1. Extensive portal vein thrombosis extending into the main portal  vein with periportal collateral vessels. No obvious cause. 2. No hepatic, bilirary or pancreatic abnormalities. Electronically signed by: Maude Stammer MD 11/01/2023 07:34 PM EDT RP Workstation: [id number]    {Document cardiac monitor, telemetry assessment procedure when appropriate:32947} Procedures   Medications Ordered in the ED  heparin bolus via infusion 4,000 Units (has no administration in time range)  heparin ADULT infusion 100 units/mL (25000 units/250mL) (has no administration in time range)  losartan  (COZAAR ) tablet 50 mg (has no administration in time range)  sodium chloride  flush (NS) 0.9 % injection 3 mL (has no administration in time range)  acetaminophen  (TYLENOL ) tablet 650 mg (has no administration in time range)    Or  acetaminophen  (TYLENOL ) suppository 650 mg (has no administration in time range)  oxyCODONE  (Oxy IR/ROXICODONE ) immediate release tablet 5 mg (has no administration in time range)  fentaNYL (SUBLIMAZE) injection 12.5-50 mcg (has no administration in time range)  senna-docusate (Senokot-S) tablet 1 tablet (has no administration in time range)  prochlorperazine (COMPAZINE) injection 5 mg (has no administration in time range)  gadobutrol (GADAVIST) 1 MMOL/ML injection 9 mL (9 mLs Intravenous Contrast Given 11/01/23 1905)      {Click here for ABCD2, HEART and other calculators REFRESH Note before signing:1}                              Medical Decision Making Amount and/or Complexity of Data Reviewed Labs: ordered. Radiology: ordered.  Risk Prescription drug management. Decision regarding hospitalization.   Patient with portal vein thrombosis.  He will be admitted to medicine and started on heparin  {  Document critical care time when appropriate  Document review of labs and clinical decision tools ie [id number], etc  Document your independent review of radiology images and any outside records  Document your discussion with family members,  caretakers and with consultants  Document social determinants of health affecting pt's care  Document your decision making why or why not admission, treatments were needed:32947:::1}   Final diagnoses:  Portal vein thrombosis    ED Discharge Orders     None

## 2023-11-01 NOTE — ED Notes (Signed)
 Pt to MRI

## 2023-11-01 NOTE — ED Triage Notes (Signed)
 Sent by outside provider for further imaging. Note below. Dull abdominal pain at this time.  'D-dimer elevated at 1.96. CT abdomen pelvis on 10/30/2023 with pancreatic head enlargement concern for acute pancreatitis versus underlying neoplasm. MRI of abdomen with and without contrast recommended. An ill-defined area in the portal vein suspicious for portal vein thrombosis noted. Also seen a small nonobstructive right renal calculus'

## 2023-11-02 ENCOUNTER — Telehealth (HOSPITAL_COMMUNITY): Payer: Self-pay | Admitting: Pharmacy Technician

## 2023-11-02 ENCOUNTER — Other Ambulatory Visit (HOSPITAL_COMMUNITY): Payer: Self-pay

## 2023-11-02 ENCOUNTER — Other Ambulatory Visit: Payer: Self-pay

## 2023-11-02 DIAGNOSIS — I81 Portal vein thrombosis: Secondary | ICD-10-CM | POA: Diagnosis present

## 2023-11-02 DIAGNOSIS — D751 Secondary polycythemia: Secondary | ICD-10-CM | POA: Diagnosis present

## 2023-11-02 DIAGNOSIS — L80 Vitiligo: Secondary | ICD-10-CM | POA: Diagnosis present

## 2023-11-02 DIAGNOSIS — D6852 Prothrombin gene mutation: Secondary | ICD-10-CM | POA: Diagnosis present

## 2023-11-02 DIAGNOSIS — Z888 Allergy status to other drugs, medicaments and biological substances status: Secondary | ICD-10-CM | POA: Diagnosis not present

## 2023-11-02 DIAGNOSIS — Z8249 Family history of ischemic heart disease and other diseases of the circulatory system: Secondary | ICD-10-CM | POA: Diagnosis not present

## 2023-11-02 DIAGNOSIS — Z833 Family history of diabetes mellitus: Secondary | ICD-10-CM | POA: Diagnosis not present

## 2023-11-02 DIAGNOSIS — Z9109 Other allergy status, other than to drugs and biological substances: Secondary | ICD-10-CM | POA: Diagnosis not present

## 2023-11-02 DIAGNOSIS — D696 Thrombocytopenia, unspecified: Secondary | ICD-10-CM | POA: Diagnosis present

## 2023-11-02 DIAGNOSIS — I10 Essential (primary) hypertension: Secondary | ICD-10-CM | POA: Diagnosis present

## 2023-11-02 DIAGNOSIS — G4730 Sleep apnea, unspecified: Secondary | ICD-10-CM | POA: Diagnosis present

## 2023-11-02 DIAGNOSIS — D6851 Activated protein C resistance: Secondary | ICD-10-CM | POA: Diagnosis present

## 2023-11-02 DIAGNOSIS — Z886 Allergy status to analgesic agent status: Secondary | ICD-10-CM | POA: Diagnosis not present

## 2023-11-02 LAB — CBC
HCT: 44.9 % (ref 39.0–52.0)
Hemoglobin: 14.4 g/dL (ref 13.0–17.0)
MCH: 24.6 pg — ABNORMAL LOW (ref 26.0–34.0)
MCHC: 32.1 g/dL (ref 30.0–36.0)
MCV: 76.8 fL — ABNORMAL LOW (ref 80.0–100.0)
Platelets: 112 K/uL — ABNORMAL LOW (ref 150–400)
RBC: 5.85 MIL/uL — ABNORMAL HIGH (ref 4.22–5.81)
RDW: 14.6 % (ref 11.5–15.5)
WBC: 4.6 K/uL (ref 4.0–10.5)
nRBC: 0 % (ref 0.0–0.2)

## 2023-11-02 LAB — HEPARIN LEVEL (UNFRACTIONATED)
Heparin Unfractionated: 0.47 [IU]/mL (ref 0.30–0.70)
Heparin Unfractionated: 0.55 [IU]/mL (ref 0.30–0.70)

## 2023-11-02 LAB — ANTITHROMBIN III: AntiThromb III Func: 94 % (ref 75–120)

## 2023-11-02 LAB — BASIC METABOLIC PANEL WITH GFR
Anion gap: 11 (ref 5–15)
BUN: 17 mg/dL (ref 8–23)
CO2: 26 mmol/L (ref 22–32)
Calcium: 8.9 mg/dL (ref 8.9–10.3)
Chloride: 103 mmol/L (ref 98–111)
Creatinine, Ser: 0.93 mg/dL (ref 0.61–1.24)
GFR, Estimated: >60 mL/min (ref >60)
Glucose, Bld: 88 mg/dL (ref 70–99)
Potassium: 3.7 mmol/L (ref 3.5–5.1)
Sodium: 140 mmol/L (ref 135–145)

## 2023-11-02 LAB — HIV ANTIBODY (ROUTINE TESTING W REFLEX): HIV Screen 4th Generation wRfx: NONREACTIVE

## 2023-11-02 MED ORDER — ORAL CARE MOUTH RINSE: 15.0000 mL | OROMUCOSAL | Status: DC | PRN

## 2023-11-02 MED ORDER — MELATONIN 5 MG PO TABS
5.0000 mg | ORAL_TABLET | Freq: Every day | ORAL | Status: DC
  Administered 2023-11-02 – 2023-11-04 (×3): 5 mg via ORAL
  Filled 2023-11-02 (×3): qty 1

## 2023-11-02 NOTE — Plan of Care (Signed)
   Problem: Clinical Measurements: Goal: Diagnostic test results will improve Outcome: Progressing   Problem: Activity: Goal: Risk for activity intolerance will decrease Outcome: Progressing   Problem: Nutrition: Goal: Adequate nutrition will be maintained Outcome: Progressing

## 2023-11-02 NOTE — Consult Note (Addendum)
 [facility]  [phone] [phone]    HEMATOLOGY CONSULTATION  PURPOSE OF CONSULTATION/CHIEF COMPLAINT:  Portal vein thrombosis  Referring MD:  Dr. Jillian   HPI: Jake Delgado is a 68 year old male patient who was admitted on 11/01/2023 with complaints of abdominal discomfort.  MRI abdomen showed extensive portal vein thrombosis.  Therefore hematology evaluation has been requested. Patient seen awake alert and oriented x 4 laying in bed.  IV heparin infusing well and patient is tolerating well.  Patient serves as principal historian for his medical history.  He recounts that around the end of July 2025 he went to [place] for a wedding.  He felt a stretch on the left side of his abdomen which was uncomfortable and sat down.  1 week later the discomfort continued and he began to feel nauseous.  He thought it was muscle related or due to the spices at the [place] he had attended.  Then 2 weeks ago he was having more abdominal pain that he rates as a 1-2/10, especially when he was eating.  Then he noticed a pain behind his right testicle.  Patient's wife is a physician examined him and said she saw no inflammation.  They applied warm compresses which were helpful however pain was persistent so he went to see his PCP on.  Labs were done and a CT scan was also done.  Patient was instructed to go to the ED due to thrombosis. Medical history includes hypertension for which she is on losartan . 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 Family hematologic history is unknown.  Reports he grew up [place] in [place].  His mother had diabetes and several maternal relatives had hypertension. Social history is noncontributory, denies tobacco use, denies alcohol use, denies illicit or recreational drug use.  Works in the [employer] with no exposure to occupational hazardous  materials.    ASSESSMENT AND PLAN:  Extensive portal vein thrombosis  --CT scan abdomen done 10/30/2023 shows pancreatic head enlargement and ill-defined high density region in the portal vein, suspicious for portal vein thrombosis.  Therefore MRI was recommended - MR abdomen done 11/02/2023 shows extensive portal vein thrombosis extending into the main portal vein with Abran portal collaterals vessels. -- IV heparin infusing well, continue per protocol. -- Pending labs: Factor V Leiden, protein S and C, prothrombin gene mutation, AFP tumor marker and CA 19-9 --Monitor closely for bleeding -- Hematology/Dr. Timmy following and will make further evaluation and treatment recommendations.   Abdominal discomfort -- Secondary to thrombosis -- Continue supportive care  Thrombocytopenia -- Mild -- Platelets 112K -- No intervention required  Erythrocytosis -- Elevated RBC 5.85.  Hemoglobin WNL 14.4 -- Patient states that his RBC has always been elevated and he thinks it is congenital -- Continue to monitor CBC with differential    Past Medical History:  Diagnosis Date   Allergy 1986   seasonal   H/O left knee surgery    MCL 2008 Dr. Hiram   History of exercise stress test 2015   exercise cardiopulmonary  test   History of varicella    Hypertension    Sleep apnea    was tested-no issues found.   Vitiligo    when younger no treatment doesn't run in families.  :  Past Surgical History:  Procedure Laterality Date   CARDIOVASCULAR STRESS TEST     COLONOSCOPY  2011   dental  implants     KNEE ARTHROSCOPY    :  Allergies  Allergen Reactions   Ansaid [Flurbiprofen] Other (See Comments)    Patients wife stated that he can not take any Ansaids   Aspirin Other (See Comments)   Dust Mite Extract     Other Reaction(s): Not available   Ibuprofen Other (See Comments)    REACTION: swelling neck and difficulty breathing   Atorvastatin  Other (See Comments)    MS side effects at  10 mg see notes 2023, muscle cramp  :   Family History  Problem Relation Age of Onset   Hypertension Mother    Diabetes Mellitus II Mother    Heart disease Other        maternal uncle in his 43s   Colon cancer Neg Hx    Colon polyps Neg Hx    Esophageal cancer Neg Hx    Stomach cancer Neg Hx    Rectal cancer Neg Hx   :   Social History   Socioeconomic History   Marital status: Married    Spouse name: Not on file   Number of children: Not on file   Years of education: Not on file   Highest education level: Master's degree (e.g., MA, MS, MEng, MEd, MSW, MBA)  Occupational History   Occupation: Surveyor, Minerals  Tobacco Use   Smoking status: Never   Smokeless tobacco: Never  Vaping Use   Vaping status: Never Used  Substance and Sexual Activity   Alcohol use: No   Drug use: No   Sexual activity: Not on file  Other Topics Concern   Not on file  Social History Narrative   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   Give ETS FA   Vegetarian but  Pos dairly and sometimes meat   Social Drivers of Health   Financial Resource Strain: Low Risk  (09/04/2023)   Overall Financial Resource Strain (CARDIA)    Difficulty of Paying Living Expenses: Not hard at all  Food Insecurity: No Food Insecurity (11/01/2023)   Hunger Vital Sign    Worried About Running Out of Food in the Last Year: Never true    Ran Out of Food in the Last Year: Never true  Transportation Needs: No Transportation Needs (11/01/2023)   PRAPARE - Administrator, Civil Service (Medical): No    Lack of Transportation (Non-Medical): No  Physical Activity: Insufficiently Active (09/04/2023)   Exercise Vital Sign    Days of Exercise per Week: 3 days    Minutes of Exercise per Session:  40 min  Stress: No Stress Concern Present (09/04/2023)   [employer] of Occupational Health - Occupational Stress Questionnaire    Feeling of Stress: Not at all  Social Connections: Unknown (11/01/2023)   Social Connection and Isolation Panel    Frequency of Communication with Friends and Family: Once a week    Frequency of Social Gatherings with Friends and Family: Patient declined    Attends Religious Services: 1 to 4 times per year    Active Member of [employer] or Organizations: Yes    Attends Banker Meetings: 1 to 4 times per year    Marital Status: Married  Intimate  Partner Violence: Not At Risk (11/01/2023)   Humiliation, Afraid, Rape, and Kick questionnaire    Fear of Current or Ex-Partner: No    Emotionally Abused: No    Physically Abused: No    Sexually Abused: No  :   CURRENT MEDS: Current Facility-Administered Medications  Medication Dose Route Frequency Provider Last Rate Last Admin   acetaminophen  (TYLENOL ) tablet 650 mg  650 mg Oral Q6H PRN Opyd, Evalene RAMAN, MD       Or   acetaminophen  (TYLENOL ) suppository 650 mg  650 mg Rectal Q6H PRN Opyd, Evalene RAMAN, MD       fentaNYL (SUBLIMAZE) injection 12.5-50 mcg  12.5-50 mcg Intravenous Q2H PRN Opyd, Evalene RAMAN, MD       heparin ADULT infusion 100 units/mL (25000 units/250mL)  1,300 Units/hr Intravenous Continuous Opyd, Evalene RAMAN, MD 13 mL/hr at 11/01/23 2158 1,300 Units/hr at 11/01/23 2158   losartan  (COZAAR ) tablet 50 mg  50 mg Oral Daily Opyd, Evalene RAMAN, MD   50 mg at 11/02/23 9077   Oral care mouth rinse  15 mL Mouth Rinse PRN Opyd, Evalene RAMAN, MD       oxyCODONE  (Oxy IR/ROXICODONE ) immediate release tablet 5 mg  5 mg Oral Q4H PRN Opyd, Evalene RAMAN, MD       prochlorperazine (COMPAZINE) injection 5 mg  5 mg Intravenous Q6H PRN Opyd, Evalene RAMAN, MD       senna-docusate (Senokot-S) tablet 1 tablet  1 tablet Oral QHS PRN Opyd, Evalene RAMAN, MD       sodium chloride  flush (NS) 0.9 % injection 3 mL  3 mL Intravenous Q12H  Opyd, Evalene RAMAN, MD   3 mL at 11/01/23 2200    PHYSICAL EXAMINATION: ECOG PERFORMANCE STATUS: 1 - Symptomatic but completely ambulatory  Vitals:   11/02/23 0451 11/02/23 0822  BP: 138/86 (!) 146/96  Pulse: (!) 56 (!) 51  Resp: 18 18  Temp: 98.1 F (36.7 C) 98 F (36.7 C)  SpO2: 97% 97%   Filed Weights   11/01/23 2000 11/01/23 2143  Weight: 193 lb 3.2 oz (87.6 kg) 186 lb 4.6 oz (84.5 kg)    GENERAL: alert, no distress and comfortable SKIN: skin color, texture, turgor are normal, no rashes or significant lesions EYES: normal, conjunctiva are pink and non-injected, sclera clear OROPHARYNX: no exudate, no erythema and lips, buccal mucosa, and tongue normal  NECK: supple, thyroid  normal size, non-tender, without nodularity LYMPH: no palpable lymphadenopathy in the cervical, axillary or inguinal LUNGS: clear to auscultation and percussion with normal breathing effort HEART: regular rate & rhythm and no murmurs and no lower extremity edema ABDOMEN: abdomen soft, non-tender and normal bowel sounds MUSCULOSKELETAL: no cyanosis of digits and no clubbing  PSYCH: alert & oriented x 3 with fluent speech NEURO: no focal motor/sensory deficits   LABS: Lab Results  Component Value Date   WBC 4.6 11/02/2023   HGB 14.4 11/02/2023   HCT 44.9 11/02/2023   MCV 76.8 (L) 11/02/2023   PLT 112 (L) 11/02/2023    Lab Results  Component Value Date   WBC 4.6 11/02/2023   HGB 14.4 11/02/2023   HCT 44.9 11/02/2023   PLT 112 (L) 11/02/2023   GLUCOSE 88 11/02/2023   CHOL 152 09/05/2023   TRIG 102.0 09/05/2023   HDL 49.60 09/05/2023   LDLCALC 82 09/05/2023   ALT 23 11/01/2023   AST 28 11/01/2023   NA 140 11/02/2023   K 3.7 11/02/2023   CL 103 11/02/2023   CREATININE  0.93 11/02/2023   BUN 17 11/02/2023   CO2 26 11/02/2023   PSA 1.44 09/05/2023   INR 1.1 11/01/2023   HGBA1C 6.3 09/05/2023   MICROALBUR <0.7 09/05/2023    MR ABDOMEN MRCP W WO CONTAST Result Date: 11/01/2023 EXAM:  MRCP WITHOUT AND WITH IV CONTRAST 11/01/2023 07:05:57 PM TECHNIQUE: Multisequence, multiplanar magnetic resonance images of the abdomen without and with intravenous contrast. MRCP sequences were performed. 9 mL Gadobutrol (GADAVIST) 1 MMOL/ML injection. COMPARISON: CT scan 10/30/2023 CLINICAL HISTORY: Epigastric abdominal pain  Abnormal CT scan. FINDINGS: LIVER: No hepatic lesions. No intrahepatic biliary dilatation. GALLBLADDER AND BILIARY SYSTEM: Gallbladder is unremarkable. No gallstones or findings for acute cholecystitis. Normal caliber and course of the common bile duct. No intrahepatic or extrahepatic ductal dilation. SPLEEN: The spleen is normal in size. No focal lesions. PANCREAS/PANCREATIC DUCT: The pancreas is unremarkable. No findings for acute pancreatitis. No mass or ductal dilatation. ADRENAL GLANDS: The adrenal glands are normal. KIDNEYS: No renal lesions or hydronephrosis. Small bilateral renal cysts not requiring any further imaging evaluation for follow-up. LYMPH NODES: No mesenteric or retroperitoneal mass or adenopathy. VASCULATURE: Fairly extensive portal vein thrombosis extending into the main portal vein. Periportal collateral vessels are noted. The aorta and branch vessels are patent. No aneurysm or dissection. The major venous structures are patent except for the portal vein. PERITONEUM: No ascites. ABDOMINAL WALL: No abdominal wall hernia. No mass. BOWEL: The stomach, duodenum, visualized small bowel, and visualized colon are unremarkable. No bowel obstruction. BONES: The bony structures are unremarkable. SOFT TISSUES: Unremarkable. MISCELLANEOUS: The lung bases are clear. No pleural or pericardial effusion. No pulmonary infiltrates. IMPRESSION: 1. Extensive portal vein thrombosis extending into the main portal vein with periportal collateral vessels. No obvious cause. 2. No hepatic, bilirary or pancreatic abnormalities. Electronically signed by: Maude Stammer MD 11/01/2023 07:34 PM EDT  RP Workstation: [id number]   MR 3D Recon At Scanner Result Date: 11/01/2023 EXAM: MRCP WITHOUT AND WITH IV CONTRAST 11/01/2023 07:05:57 PM TECHNIQUE: Multisequence, multiplanar magnetic resonance images of the abdomen without and with intravenous contrast. MRCP sequences were performed. 9 mL Gadobutrol (GADAVIST) 1 MMOL/ML injection. COMPARISON: CT scan 10/30/2023 CLINICAL HISTORY: Epigastric abdominal pain  Abnormal CT scan. FINDINGS: LIVER: No hepatic lesions. No intrahepatic biliary dilatation. GALLBLADDER AND BILIARY SYSTEM: Gallbladder is unremarkable. No gallstones or findings for acute cholecystitis. Normal caliber and course of the common bile duct. No intrahepatic or extrahepatic ductal dilation. SPLEEN: The spleen is normal in size. No focal lesions. PANCREAS/PANCREATIC DUCT: The pancreas is unremarkable. No findings for acute pancreatitis. No mass or ductal dilatation. ADRENAL GLANDS: The adrenal glands are normal. KIDNEYS: No renal lesions or hydronephrosis. Small bilateral renal cysts not requiring any further imaging evaluation for follow-up. LYMPH NODES: No mesenteric or retroperitoneal mass or adenopathy. VASCULATURE: Fairly extensive portal vein thrombosis extending into the main portal vein. Periportal collateral vessels are noted. The aorta and branch vessels are patent. No aneurysm or dissection. The major venous structures are patent except for the portal vein. PERITONEUM: No ascites. ABDOMINAL WALL: No abdominal wall hernia. No mass. BOWEL: The stomach, duodenum, visualized small bowel, and visualized colon are unremarkable. No bowel obstruction. BONES: The bony structures are unremarkable. SOFT TISSUES: Unremarkable. MISCELLANEOUS: The lung bases are clear. No pleural or pericardial effusion. No pulmonary infiltrates. IMPRESSION: 1. Extensive portal vein thrombosis extending into the main portal vein with periportal collateral vessels. No obvious cause. 2. No hepatic, bilirary or  pancreatic abnormalities. Electronically signed by: Maude Stammer MD 11/01/2023 07:34 PM EDT  RP Workstation: [id number]   CT ABDOMEN PELVIS WO CONTRAST Result Date: 10/30/2023 EXAM: CT ABDOMEN AND PELVIS WITHOUT CONTRAST 10/30/2023 11:23:09 AM TECHNIQUE: CT of the abdomen and pelvis was performed without the administration of intravenous contrast. Multiplanar reformatted images are provided for review. Automated exposure control, iterative reconstruction, and/or weight-based adjustment of the mA/kV was utilized to reduce the radiation dose to as low as reasonably achievable. COMPARISON: 11/01/2020 CLINICAL HISTORY: worsening epigastric pain with radiation into R testicle x 2-3 months. initially with emesis. Patient thinks he pulled a muscle on 08/02/23; Having continued pain and discomfort in upper abd, epigastric area ; As well as pain in RT scrotal area, RT testicle; Not sure if any of this is related to each other ; Hx HTN on meds FINDINGS: LOWER CHEST: No acute abnormality. LIVER: Ill-defined high density is seen in the region of the portal vein suggesting possible portal vein thrombosis. Possible large collateral vein seen in the porta hepatis region. GALLBLADDER AND BILE DUCTS: Gallbladder is unremarkable. No biliary ductal dilatation. SPLEEN: No acute abnormality. PANCREAS: Pancreatic head enlargement is noted concerning for either acute pancreatitis or possibly underlying neoplasm. Further evaluation with MRI is recommended. ADRENAL GLANDS: No acute abnormality. KIDNEYS, URETERS AND BLADDER: Small nonobstructive right renal calculus. No stones in the left kidney or ureters. No hydronephrosis. No perinephric or periureteral stranding. Urinary bladder is unremarkable. GI AND BOWEL: Stomach demonstrates no acute abnormality. There is no bowel obstruction. PERITONEUM AND RETROPERITONEUM: No ascites. No free air. VASCULATURE: Aortic atherosclerosis. Aorta is normal in caliber. LYMPH NODES: No  lymphadenopathy. REPRODUCTIVE ORGANS: No acute abnormality. BONES AND SOFT TISSUES: No acute osseous abnormality. No focal soft tissue abnormality. IMPRESSION: 1. Pancreatic head enlargement, differential includes acute pancreatitis versus underlying neoplasm. Recommend MRI of the abdomen with and without contrast for further evaluation. 2. Ill-defined high density in the region of the portal vein, suspicious for portal vein thrombosis. Recommend Doppler ultrasound or contrast-enhanced CT/MR venography for confirmation. 3. Small nonobstructive right renal calculus. No specific imaging follow-up required unless symptomatic. Electronically signed by: Lynwood Seip MD 10/30/2023 11:52 AM EDT RP Workstation: [id number]     The total time spent in the appointment was 40 minutes encounter with patients including review of chart and various tests results, discussions about plan of care and coordination of care plan   All questions were answered. The patient knows to call the clinic with any problems, questions or concerns. No barriers to learning was detected.  Thank you for the courtesy of this consultation, Olam JINNY Brunner, NP  10/31/202510:42 AM  ADDENDUM: I saw and examined Jake Delgado.  He is very nice.  What I find incredibly interesting is that he has vitiligo.  I was wondering why he was so fair skinned.  He said when he was in [place]  he developed vitiligo.  He now has a portal vein thrombus.  This appears to be idiopathic.  He had hypercoagulable studies done.  I suspect they will not be back for a couple weeks.  His labs showed slight thrombocytopenia.  His white cell count is 4.6.  Hemoglobin 14.4.  He has a normal white cell differential.  His electrolytes all look okay.  There is no evidence of cirrhosis.  I would be shocked if he had any kind of underlying malignancy.  I would certainly not undergo a hunt for malignancy.  He currently is on heparin.  I will keep him on heparin for a couple  more days and then switch him over to  an oral anticoagulant.  Sometimes, portal vein thrombi can be found in rare hematologic conditions.  I do not see anything that would qualify for that (i.e. PNH).  Again, I suspect this is idiopathic.  He will need anticoagulation for quite a while in my opinion.  Again, I will keep him on heparin for right now.  I think this is working fairly well.  I cannot think of any association between coagulation and vitiligo.  We will follow along.    Jeralyn Crease, MD

## 2023-11-02 NOTE — Telephone Encounter (Signed)
 Patient Product/process Development Scientist completed.    The patient is insured through [facility]. Patient has [employer], may use a copay card, and/or apply for patient assistance if available.    Ran test claim for Eliquis 5 mg and the current 30 day co-pay is $440.00 due to a $400.00 deductible.  Will be $40.00 once deductible is met.  Ran test claim for Xarelto 20 mg and the current 30 day co-pay is $440.00 due to a $400.00 deductible.  Will be $40.00 once deductible is met.  This test claim was processed through [facility]- copay amounts may vary at other pharmacies due to pharmacy/plan contracts, or as the patient moves through the different stages of their insurance plan.     Reyes Sharps, CPHT Pharmacy Technician Patient Advocate Specialist Lead [facility] Patient Advocate Team Direct Number: [phone]  Fax: [phone]

## 2023-11-02 NOTE — Progress Notes (Signed)
 PHARMACY - ANTICOAGULATION CONSULT NOTE  Pharmacy Consult for heparin Indication: portal vein thrombosis  Allergies  Allergen Reactions   Ansaid [Flurbiprofen] Other (See Comments)    Patients wife stated that he can not take any Ansaids   Aspirin Other (See Comments)   Dust Mite Extract     Other Reaction(s): Not available   Ibuprofen Other (See Comments)    REACTION: swelling neck and difficulty breathing   Atorvastatin  Other (See Comments)    MS side effects at 10 mg see notes 2023, muscle cramp    Patient Measurements: Height: 5' 9 (175.3 cm) Weight: 84.5 kg (186 lb 4.6 oz) IBW/kg (Calculated) : 70.7 HEPARIN DW (KG): 84.5  Vital Signs: Temp: 98 F (36.7 C) (10/31 0822) Temp Source: Oral (10/31 0451) BP: 146/96 (10/31 0822) Pulse Rate: 51 (10/31 0822)  Labs: Recent Labs    11/01/23 1713 11/01/23 2011 11/02/23 0443  HGB 15.0  --  14.4  HCT 47.3  --  44.9  PLT 150  --  112*  APTT  --  <22*  --   LABPROT  --  15.0  --   INR  --  1.1  --   HEPARINUNFRC  --   --  0.47  CREATININE 0.96  --  0.93    Estimated Creatinine Clearance: 79.2 mL/min (by C-G formula based on SCr of 0.93 mg/dL).  Medications: No anticoagulants PTA  Assessment: Pt is a 80 yoM found to have extensive portal vein thrombosis. Pharmacy consulted to dose heparin.   Today, 11/02/23: Confirmatory heparin level = 0.55 remains therapeutic on heparin infusion of 1300 units/hr CBC: Hgb WNL, Plt low No bleeding or complications reported  Goal of Therapy:  Heparin level 0.3-0.7 units/ml Monitor platelets by anticoagulation protocol: Yes   Plan:  Continue heparin infusion at 1300 units/hr CBC, heparin level daily Monitor for signs of bleeding  Ronal CHRISTELLA Rav, PharmD 11/02/23 10:43 AM

## 2023-11-02 NOTE — Progress Notes (Signed)
 PHARMACY - ANTICOAGULATION CONSULT NOTE  Pharmacy Consult for heparin Indication: portal vein thrombosis  Allergies  Allergen Reactions   Ansaid [Flurbiprofen] Other (See Comments)    Patients wife stated that he can not take any Ansaids   Aspirin Other (See Comments)   Dust Mite Extract     Other Reaction(s): Not available   Ibuprofen Other (See Comments)    REACTION: swelling neck and difficulty breathing   Atorvastatin  Other (See Comments)    MS side effects at 10 mg see notes 2023, muscle cramp    Patient Measurements: Height: 5' 9 (175.3 cm) Weight: 84.5 kg (186 lb 4.6 oz) IBW/kg (Calculated) : 70.7 HEPARIN DW (KG): 84.5  Vital Signs: Temp: 98.1 F (36.7 C) (10/31 0451) Temp Source: Oral (10/31 0451) BP: 138/86 (10/31 0451) Pulse Rate: 56 (10/31 0451)  Labs: Recent Labs    11/01/23 1713 11/01/23 2011 11/02/23 0443  HGB 15.0  --  14.4  HCT 47.3  --  44.9  PLT 150  --  112*  APTT  --  <22*  --   LABPROT  --  15.0  --   INR  --  1.1  --   HEPARINUNFRC  --   --  0.47  CREATININE 0.96  --   --     Estimated Creatinine Clearance: 76.7 mL/min (by C-G formula based on SCr of 0.96 mg/dL).   Medical History: Past Medical History:  Diagnosis Date   Allergy 1986   seasonal   H/O left knee surgery    MCL 2008 Dr. Hiram   History of exercise stress test 2015   exercise cardiopulmonary  test   History of varicella    Hypertension    Sleep apnea    was tested-no issues found.   Vitiligo    when younger no treatment doesn't run in families.    Medications:  Medications Prior to Admission  Medication Sig Dispense Refill Last Dose/Taking   levocetirizine (XYZAL) 5 MG tablet Take 5 mg by mouth every evening.   Past Week   losartan  (COZAAR ) 50 MG tablet TAKE 1 TABLET BY MOUTH EVERY DAY 90 tablet 3 11/01/2023 Morning   azithromycin (ZITHROMAX) 250 MG tablet Take 250 mg by mouth See admin instructions. Patient is taking this prescription to [place] in case of  infection      Scheduled:   Assessment: 68 YO male presenting with elevated D-dimer, imaging concerns for acute pancreatitis vs neoplasm and suspicious portal vein thrombosis. 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 Patient not on anticoagulation PTA, no history of bleeding upon chart review. Pharmacy consulted for heparin dosing.   Today, 11/02/23: HL 0.47 therapeutic on 1300 units/hr Hgb 14.4, plts down 112 No bleeding noted per RN  Goal of Therapy:  Heparin level 0.3-0.7 units/ml Monitor platelets by anticoagulation protocol: Yes   Plan:   Continue heparin infusion @1300  units/hr Confirmatory heparin level in 6 hours Monitor heparin level, CBC, and s/sx of bleeding daily   Leeroy Mace RPh 11/02/2023, 5:40 AM

## 2023-11-02 NOTE — Progress Notes (Signed)
 PROGRESS NOTE  Anne Sebring  FMW:[id number] DOB: [birth year] DOA: 11/01/2023 PCP: Charlett [name], MD   Brief Narrative: Patient is a 68 year old male with history of vitiligo, hypertension who presented with abdominal discomfort.  Discomfort described as a vague with appearance after eating.  He had an outpatient CT scan that was concerning for enlargement of pancreatic head, findings concerning for possible portal vein thrombosis.  No history of VTE.  On presentation, he was hemodynamically stable.  MRI showed extensive portal vein thrombosis without obvious cause.  Started on IV heparin.  Assessment & Plan:  Principal Problem:   Portal vein thrombosis Active Problems:   Essential hypertension  Portal vein thrombosis: Complaint of vague abdominal discomfort after eating.  No nausea or vomiting.He had an outpatient CT scan that was concerning for enlargement of pancreatic head, findings concerning for possible portal vein thrombosis.  MRI done here showed extensive portal vein thrombosis ,no hepatobiliary or pancreatic abnormalities.  Etiology unclear.  Currently on heparin drip. Hypercoagulable work up,prothrombin mutation, Factor 5 leiden,protein C and S, Antithrombin III, AFP.  CA 19-9 pending Will consult hematology/oncology.  Abdomen is benign on examination today.  No abdomen pain, nausea or vomiting.  No postprandial discomfort  Hypertension: Losartan .  Currently blood pressure stable        DVT prophylaxis:IV heparin     Code Status: Full Code  Family Communication: Called and discussed with wife Dr. Vincente today on phone on 10/31  Patient status: Obs  Patient is from : Home  Anticipated discharge un:ynfz  Estimated DC date:tomorrow   Consultants: Hematology/oncology  Procedures:None  Antimicrobials:  Anti-infectives (From admission, onward)    None       Subjective: Patient seen and examined at bedside today.  Hemodynamically stable.  Comfortably sitting  on bed.  Ate his breakfast.  Did not have any postprandial abdominal discomfort.  Abdomen is benign on examination.  Objective: Vitals:   11/01/23 2155 11/02/23 0204 11/02/23 0451 11/02/23 0822  BP: (!) 158/104 135/87 138/86 (!) 146/96  Pulse: 60 (!) 57 (!) 56 (!) 51  Resp:  18 18 18   Temp:  97.8 F (36.6 C) 98.1 F (36.7 C) 98 F (36.7 C)  TempSrc:  Oral Oral   SpO2:  96% 97% 97%  Weight:      Height:        Intake/Output Summary (Last 24 hours) at 11/02/2023 0846 Last data filed at 11/02/2023 0457 Gross per 24 hour  Intake 308.66 ml  Output 750 ml  Net -441.34 ml   Filed Weights   11/01/23 2000 11/01/23 2143  Weight: 87.6 kg 84.5 kg    Examination:  General exam: Overall comfortable, not in distress HEENT: PERRL Respiratory system:  no wheezes or crackles  Cardiovascular system: S1 & S2 heard, RRR.  Gastrointestinal system: Abdomen is nondistended, soft and nontender. Central nervous system: Alert and oriented Extremities: No edema, no clubbing ,no cyanosis Skin: No rashes, no ulcers,no icterus     Data Reviewed: I have personally reviewed following labs and imaging studies  CBC: Recent Labs  Lab 10/29/23 1349 11/01/23 1713 11/02/23 0443  WBC 5.1 6.2 4.6  NEUTROABS 3.2 3.7  --   HGB 14.5 15.0 14.4  HCT 45.9 47.3 44.9  MCV 77.1* 76.8* 76.8*  PLT 141.0* 150 112*   Basic Metabolic Panel: Recent Labs  Lab 10/29/23 1349 11/01/23 1713 11/02/23 0443  NA 137 138 140  K 4.2 4.3 3.7  CL 103 102 103  CO2 28 24 26  GLUCOSE 98 104* 88  BUN 23 24* 17  CREATININE 0.88 0.96 0.93  CALCIUM  9.1 9.3 8.9     No results found for this or any previous visit (from the past 240 hours).   Radiology Studies: MR ABDOMEN MRCP W WO CONTAST Result Date: 11/01/2023 EXAM: MRCP WITHOUT AND WITH IV CONTRAST 11/01/2023 07:05:57 PM TECHNIQUE: Multisequence, multiplanar magnetic resonance images of the abdomen without and with intravenous contrast. MRCP sequences were  performed. 9 mL Gadobutrol (GADAVIST) 1 MMOL/ML injection. COMPARISON: CT scan 10/30/2023 CLINICAL HISTORY: Epigastric abdominal pain  Abnormal CT scan. FINDINGS: LIVER: No hepatic lesions. No intrahepatic biliary dilatation. GALLBLADDER AND BILIARY SYSTEM: Gallbladder is unremarkable. No gallstones or findings for acute cholecystitis. Normal caliber and course of the common bile duct. No intrahepatic or extrahepatic ductal dilation. SPLEEN: The spleen is normal in size. No focal lesions. PANCREAS/PANCREATIC DUCT: The pancreas is unremarkable. No findings for acute pancreatitis. No mass or ductal dilatation. ADRENAL GLANDS: The adrenal glands are normal. KIDNEYS: No renal lesions or hydronephrosis. Small bilateral renal cysts not requiring any further imaging evaluation for follow-up. LYMPH NODES: No mesenteric or retroperitoneal mass or adenopathy. VASCULATURE: Fairly extensive portal vein thrombosis extending into the main portal vein. Periportal collateral vessels are noted. The aorta and branch vessels are patent. No aneurysm or dissection. The major venous structures are patent except for the portal vein. PERITONEUM: No ascites. ABDOMINAL WALL: No abdominal wall hernia. No mass. BOWEL: The stomach, duodenum, visualized small bowel, and visualized colon are unremarkable. No bowel obstruction. BONES: The bony structures are unremarkable. SOFT TISSUES: Unremarkable. MISCELLANEOUS: The lung bases are clear. No pleural or pericardial effusion. No pulmonary infiltrates. IMPRESSION: 1. Extensive portal vein thrombosis extending into the main portal vein with periportal collateral vessels. No obvious cause. 2. No hepatic, bilirary or pancreatic abnormalities. Electronically signed by: Maude Stammer MD 11/01/2023 07:34 PM EDT RP Workstation: [id number]   MR 3D Recon At Scanner Result Date: 11/01/2023 EXAM: MRCP WITHOUT AND WITH IV CONTRAST 11/01/2023 07:05:57 PM TECHNIQUE: Multisequence, multiplanar magnetic  resonance images of the abdomen without and with intravenous contrast. MRCP sequences were performed. 9 mL Gadobutrol (GADAVIST) 1 MMOL/ML injection. COMPARISON: CT scan 10/30/2023 CLINICAL HISTORY: Epigastric abdominal pain  Abnormal CT scan. FINDINGS: LIVER: No hepatic lesions. No intrahepatic biliary dilatation. GALLBLADDER AND BILIARY SYSTEM: Gallbladder is unremarkable. No gallstones or findings for acute cholecystitis. Normal caliber and course of the common bile duct. No intrahepatic or extrahepatic ductal dilation. SPLEEN: The spleen is normal in size. No focal lesions. PANCREAS/PANCREATIC DUCT: The pancreas is unremarkable. No findings for acute pancreatitis. No mass or ductal dilatation. ADRENAL GLANDS: The adrenal glands are normal. KIDNEYS: No renal lesions or hydronephrosis. Small bilateral renal cysts not requiring any further imaging evaluation for follow-up. LYMPH NODES: No mesenteric or retroperitoneal mass or adenopathy. VASCULATURE: Fairly extensive portal vein thrombosis extending into the main portal vein. Periportal collateral vessels are noted. The aorta and branch vessels are patent. No aneurysm or dissection. The major venous structures are patent except for the portal vein. PERITONEUM: No ascites. ABDOMINAL WALL: No abdominal wall hernia. No mass. BOWEL: The stomach, duodenum, visualized small bowel, and visualized colon are unremarkable. No bowel obstruction. BONES: The bony structures are unremarkable. SOFT TISSUES: Unremarkable. MISCELLANEOUS: The lung bases are clear. No pleural or pericardial effusion. No pulmonary infiltrates. IMPRESSION: 1. Extensive portal vein thrombosis extending into the main portal vein with periportal collateral vessels. No obvious cause. 2. No hepatic, bilirary or pancreatic abnormalities. Electronically signed by: Maude Stammer  MD 11/01/2023 07:34 PM EDT RP Workstation: [id number]    Scheduled Meds:  losartan   50 mg Oral Daily   sodium chloride  flush   3 mL Intravenous Q12H   Continuous Infusions:  heparin 1,300 Units/hr (11/01/23 2158)     LOS: 0 days   Ivonne Mustache, MD [facility] P10/31/2025, 8:46 AM

## 2023-11-03 DIAGNOSIS — I81 Portal vein thrombosis: Secondary | ICD-10-CM | POA: Diagnosis not present

## 2023-11-03 LAB — BASIC METABOLIC PANEL WITH GFR
Anion gap: 11 (ref 5–15)
BUN: 15 mg/dL (ref 8–23)
CO2: 25 mmol/L (ref 22–32)
Calcium: 9 mg/dL (ref 8.9–10.3)
Chloride: 103 mmol/L (ref 98–111)
Creatinine, Ser: 0.95 mg/dL (ref 0.61–1.24)
GFR, Estimated: >60 mL/min (ref >60)
Glucose, Bld: 87 mg/dL (ref 70–99)
Potassium: 3.8 mmol/L (ref 3.5–5.1)
Sodium: 139 mmol/L (ref 135–145)

## 2023-11-03 LAB — AFP TUMOR MARKER: AFP, Serum, Tumor Marker: 3.3 ng/mL (ref 0.0–8.4)

## 2023-11-03 LAB — CBC
HCT: 46.2 % (ref 39.0–52.0)
Hemoglobin: 14.5 g/dL (ref 13.0–17.0)
MCH: 24.5 pg — ABNORMAL LOW (ref 26.0–34.0)
MCHC: 31.4 g/dL (ref 30.0–36.0)
MCV: 78 fL — ABNORMAL LOW (ref 80.0–100.0)
Platelets: 103 K/uL — ABNORMAL LOW (ref 150–400)
RBC: 5.92 MIL/uL — ABNORMAL HIGH (ref 4.22–5.81)
RDW: 14.8 % (ref 11.5–15.5)
WBC: 4.6 K/uL (ref 4.0–10.5)
nRBC: 0 % (ref 0.0–0.2)

## 2023-11-03 LAB — CANCER ANTIGEN 19-9: CA 19-9: 9 U/mL (ref 0–35)

## 2023-11-03 LAB — HEPARIN LEVEL (UNFRACTIONATED): Heparin Unfractionated: 0.56 [IU]/mL (ref 0.30–0.70)

## 2023-11-03 NOTE — Progress Notes (Signed)
 PROGRESS NOTE  Jake Delgado  FMW:[id number] DOB: [birth year] DOA: 11/01/2023 PCP: Charlett [name], MD   Brief Narrative: Patient is a 69 year old male with history of vitiligo, hypertension who presented with abdominal discomfort.  Discomfort described as a vague with appearance after eating.  He had an outpatient CT scan that was concerning for enlargement of pancreatic head, findings concerning for possible portal vein thrombosis.  No history of VTE.  On presentation, he was hemodynamically stable.  MRI showed extensive portal vein thrombosis without obvious cause.  Started on IV heparin.  Assessment & Plan:  Principal Problem:   Portal vein thrombosis Active Problems:   Essential hypertension  Portal vein thrombosis: Complaint of vague abdominal discomfort after eating.  No nausea or vomiting.He had an outpatient CT scan that was concerning for enlargement of pancreatic head, findings concerning for possible portal vein thrombosis.  MRI done here showed extensive portal vein thrombosis ,no hepatobiliary or pancreatic abnormalities.  Etiology unclear.  Currently on heparin drip. Hypercoagulable work up,prothrombin mutation, Factor 5 leiden,protein C and S pending. Negative Antithrombin III, AFP and CA 19-9 . Consulted  hematology/oncology.  Abdomen is benign on examination today.  No abdomen pain, nausea or vomiting.  No postprandial discomfort. Hematology recommended to continue IV heparin for now.  Dr. Timmy following.  Hypertension: Losartan .  Currently blood pressure stable        DVT prophylaxis:IV heparin     Code Status: Full Code  Family Communication: Called and discussed with wife Dr. Vincente on phone on 10/31  Patient status: Obs  Patient is from : Home  Anticipated discharge un:ynfz  Estimated DC date:tomorrow   Consultants: Hematology/oncology  Procedures:None  Antimicrobials:  Anti-infectives (From admission, onward)    None        Subjective: Patient seen and examined at bedside today.  Hemodynamically stable.  Comfortable.  Ambulating inside the room.  Denies any abdominal pain, nausea or vomiting.  No new complaints  Objective: Vitals:   11/02/23 0822 11/02/23 1414 11/02/23 1918 11/03/23 0447  BP: (!) 146/96 131/83 (!) 129/94 139/84  Pulse: (!) 51 (!) 59 (!) 56 (!) 51  Resp: 18 17 18 17   Temp: 98 F (36.7 C) 97.7 F (36.5 C) 97.8 F (36.6 C) 97.7 F (36.5 C)  TempSrc:   Oral Oral  SpO2: 97% 96% 97% 96%  Weight:      Height:        Intake/Output Summary (Last 24 hours) at 11/03/2023 1122 Last data filed at 11/03/2023 0700 Gross per 24 hour  Intake --  Output 200 ml  Net -200 ml   Filed Weights   11/01/23 2000 11/01/23 2143  Weight: 87.6 kg 84.5 kg    Examination:  General exam: Overall comfortable, not in distress HEENT: PERRL Respiratory system:  no wheezes or crackles  Cardiovascular system: S1 & S2 heard, RRR.  Gastrointestinal system: Abdomen is nondistended, soft and nontender. Central nervous system: Alert and oriented Extremities: No edema, no clubbing ,no cyanosis Skin: No rashes, no ulcers,no icterus       Data Reviewed: I have personally reviewed following labs and imaging studies  CBC: Recent Labs  Lab 10/29/23 1349 11/01/23 1713 11/02/23 0443 11/03/23 0438  WBC 5.1 6.2 4.6 4.6  NEUTROABS 3.2 3.7  --   --   HGB 14.5 15.0 14.4 14.5  HCT 45.9 47.3 44.9 46.2  MCV 77.1* 76.8* 76.8* 78.0*  PLT 141.0* 150 112* 103*   Basic Metabolic Panel: Recent Labs  Lab 10/29/23 1349  11/01/23 1713 11/02/23 0443 11/03/23 0438  NA 137 138 140 139  K 4.2 4.3 3.7 3.8  CL 103 102 103 103  CO2 28 24 26 25   GLUCOSE 98 104* 88 87  BUN 23 24* 17 15  CREATININE 0.88 0.96 0.93 0.95  CALCIUM  9.1 9.3 8.9 9.0     No results found for this or any previous visit (from the past 240 hours).   Radiology Studies: MR ABDOMEN MRCP W WO CONTAST Result Date: 11/01/2023 EXAM: MRCP WITHOUT  AND WITH IV CONTRAST 11/01/2023 07:05:57 PM TECHNIQUE: Multisequence, multiplanar magnetic resonance images of the abdomen without and with intravenous contrast. MRCP sequences were performed. 9 mL Gadobutrol (GADAVIST) 1 MMOL/ML injection. COMPARISON: CT scan 10/30/2023 CLINICAL HISTORY: Epigastric abdominal pain  Abnormal CT scan. FINDINGS: LIVER: No hepatic lesions. No intrahepatic biliary dilatation. GALLBLADDER AND BILIARY SYSTEM: Gallbladder is unremarkable. No gallstones or findings for acute cholecystitis. Normal caliber and course of the common bile duct. No intrahepatic or extrahepatic ductal dilation. SPLEEN: The spleen is normal in size. No focal lesions. PANCREAS/PANCREATIC DUCT: The pancreas is unremarkable. No findings for acute pancreatitis. No mass or ductal dilatation. ADRENAL GLANDS: The adrenal glands are normal. KIDNEYS: No renal lesions or hydronephrosis. Small bilateral renal cysts not requiring any further imaging evaluation for follow-up. LYMPH NODES: No mesenteric or retroperitoneal mass or adenopathy. VASCULATURE: Fairly extensive portal vein thrombosis extending into the main portal vein. Periportal collateral vessels are noted. The aorta and branch vessels are patent. No aneurysm or dissection. The major venous structures are patent except for the portal vein. PERITONEUM: No ascites. ABDOMINAL WALL: No abdominal wall hernia. No mass. BOWEL: The stomach, duodenum, visualized small bowel, and visualized colon are unremarkable. No bowel obstruction. BONES: The bony structures are unremarkable. SOFT TISSUES: Unremarkable. MISCELLANEOUS: The lung bases are clear. No pleural or pericardial effusion. No pulmonary infiltrates. IMPRESSION: 1. Extensive portal vein thrombosis extending into the main portal vein with periportal collateral vessels. No obvious cause. 2. No hepatic, bilirary or pancreatic abnormalities. Electronically signed by: Maude Stammer MD 11/01/2023 07:34 PM EDT RP  Workstation: [id number]   MR 3D Recon At Scanner Result Date: 11/01/2023 EXAM: MRCP WITHOUT AND WITH IV CONTRAST 11/01/2023 07:05:57 PM TECHNIQUE: Multisequence, multiplanar magnetic resonance images of the abdomen without and with intravenous contrast. MRCP sequences were performed. 9 mL Gadobutrol (GADAVIST) 1 MMOL/ML injection. COMPARISON: CT scan 10/30/2023 CLINICAL HISTORY: Epigastric abdominal pain  Abnormal CT scan. FINDINGS: LIVER: No hepatic lesions. No intrahepatic biliary dilatation. GALLBLADDER AND BILIARY SYSTEM: Gallbladder is unremarkable. No gallstones or findings for acute cholecystitis. Normal caliber and course of the common bile duct. No intrahepatic or extrahepatic ductal dilation. SPLEEN: The spleen is normal in size. No focal lesions. PANCREAS/PANCREATIC DUCT: The pancreas is unremarkable. No findings for acute pancreatitis. No mass or ductal dilatation. ADRENAL GLANDS: The adrenal glands are normal. KIDNEYS: No renal lesions or hydronephrosis. Small bilateral renal cysts not requiring any further imaging evaluation for follow-up. LYMPH NODES: No mesenteric or retroperitoneal mass or adenopathy. VASCULATURE: Fairly extensive portal vein thrombosis extending into the main portal vein. Periportal collateral vessels are noted. The aorta and branch vessels are patent. No aneurysm or dissection. The major venous structures are patent except for the portal vein. PERITONEUM: No ascites. ABDOMINAL WALL: No abdominal wall hernia. No mass. BOWEL: The stomach, duodenum, visualized small bowel, and visualized colon are unremarkable. No bowel obstruction. BONES: The bony structures are unremarkable. SOFT TISSUES: Unremarkable. MISCELLANEOUS: The lung bases are clear. No pleural or pericardial  effusion. No pulmonary infiltrates. IMPRESSION: 1. Extensive portal vein thrombosis extending into the main portal vein with periportal collateral vessels. No obvious cause. 2. No hepatic, bilirary or pancreatic  abnormalities. Electronically signed by: Maude Stammer MD 11/01/2023 07:34 PM EDT RP Workstation: [id number]    Scheduled Meds:  losartan   50 mg Oral Daily   melatonin  5 mg Oral QHS   sodium chloride  flush  3 mL Intravenous Q12H   Continuous Infusions:  heparin 1,300 Units/hr (11/03/23 0954)     LOS: 1 day   Ivonne Mustache, MD [facility] P11/01/2023, 11:22 AM

## 2023-11-03 NOTE — Progress Notes (Signed)
 PHARMACY - ANTICOAGULATION CONSULT NOTE  Pharmacy Consult for heparin Indication: portal vein thrombosis  Allergies  Allergen Reactions   Ansaid [Flurbiprofen] Other (See Comments)    Patients wife stated that he can not take any Ansaids   Aspirin Other (See Comments)   Dust Mite Extract     Other Reaction(s): Not available   Ibuprofen Other (See Comments)    REACTION: swelling neck and difficulty breathing   Atorvastatin  Other (See Comments)    MS side effects at 10 mg see notes 2023, muscle cramp    Patient Measurements: Height: 5' 9 (175.3 cm) Weight: 84.5 kg (186 lb 4.6 oz) IBW/kg (Calculated) : 70.7 HEPARIN DW (KG): 84.5  Vital Signs: Temp: 97.7 F (36.5 C) (11/01 0447) Temp Source: Oral (11/01 0447) BP: 139/84 (11/01 0447) Pulse Rate: 51 (11/01 0447)  Labs: Recent Labs    11/01/23 1713 11/01/23 2011 11/02/23 0443 11/02/23 1034 11/03/23 0438  HGB 15.0  --  14.4  --  14.5  HCT 47.3  --  44.9  --  46.2  PLT 150  --  112*  --  103*  APTT  --  <22*  --   --   --   LABPROT  --  15.0  --   --   --   INR  --  1.1  --   --   --   HEPARINUNFRC  --   --  0.47 0.55 0.56  CREATININE 0.96  --  0.93  --  0.95    Estimated Creatinine Clearance: 77.5 mL/min (by C-G formula based on SCr of 0.95 mg/dL).  Medications: No anticoagulants PTA  Assessment: Pt is a 72 yoM found to have extensive portal vein thrombosis. Pharmacy consulted to dose heparin.   Today, 11/03/23: Heparin level = 0.56 remains therapeutic on heparin infusion of 1300 units/hr CBC: Hgb WNL, Plt low No bleeding or complications reported  Goal of Therapy:  Heparin level 0.3-0.7 units/ml Monitor platelets by anticoagulation protocol: Yes   Plan:  Continue heparin infusion at 1300 units/hr CBC, heparin level daily Monitor for signs of bleeding  Ronal CHRISTELLA Rav, PharmD 11/03/23 8:29 AM

## 2023-11-04 DIAGNOSIS — I81 Portal vein thrombosis: Secondary | ICD-10-CM | POA: Diagnosis not present

## 2023-11-04 LAB — CBC
HCT: 47.1 % (ref 39.0–52.0)
Hemoglobin: 14.7 g/dL (ref 13.0–17.0)
MCH: 24.1 pg — ABNORMAL LOW (ref 26.0–34.0)
MCHC: 31.2 g/dL (ref 30.0–36.0)
MCV: 77.1 fL — ABNORMAL LOW (ref 80.0–100.0)
Platelets: 101 K/uL — ABNORMAL LOW (ref 150–400)
RBC: 6.11 MIL/uL — ABNORMAL HIGH (ref 4.22–5.81)
RDW: 14.6 % (ref 11.5–15.5)
WBC: 3.9 K/uL — ABNORMAL LOW (ref 4.0–10.5)
nRBC: 0 % (ref 0.0–0.2)

## 2023-11-04 LAB — HEPARIN LEVEL (UNFRACTIONATED): Heparin Unfractionated: 0.59 [IU]/mL (ref 0.30–0.70)

## 2023-11-04 LAB — PROTEIN S, TOTAL: Protein S Ag, Total: 87 % (ref 60–150)

## 2023-11-04 NOTE — Progress Notes (Signed)
 PROGRESS NOTE  Jake Delgado  FMW:[id number] DOB: October 20, 1955 DOA: 11/01/2023 PCP: Charlett [name], MD   Brief Narrative: Patient is a 68 year old male with history of vitiligo, hypertension who presented with abdominal discomfort.  Discomfort described as a vague with appearance after eating.  He had an outpatient CT scan that was concerning for enlargement of pancreatic head, findings concerning for possible portal vein thrombosis.  No history of VTE.  On presentation, he was hemodynamically stable.  MRI showed extensive portal vein thrombosis without obvious cause.  Started on IV heparin.  Plan to transition to Eliquis tomorrow and discharge home  Assessment & Plan:  Principal Problem:   Portal vein thrombosis Active Problems:   Essential hypertension  Portal vein thrombosis: Complaint of vague abdominal discomfort after eating.  No nausea or vomiting.He had an outpatient CT scan that was concerning for enlargement of pancreatic head, findings concerning for possible portal vein thrombosis.  MRI done here showed extensive portal vein thrombosis ,no hepatobiliary or pancreatic abnormalities.  Etiology unclear.  Currently on heparin drip. Hypercoagulable work up,prothrombin mutation, Factor 5 leiden,protein C and S pending. Negative Antithrombin III, AFP and CA 19-9 . Consulted  hematology/oncology.  Abdomen is benign on examination today.  No abdomen pain, nausea or vomiting.  No postprandial discomfort. Hematology recommended to continue IV heparin for now.  Dr. Timmy following.  Will likely transition him to Eliquis tomorrow and  discharge.  Hypertension: Losartan .  Currently blood pressure stable        DVT prophylaxis:IV heparin     Code Status: Full Code  Family Communication: Discussed with family members at bedside  Patient status: Inpatient  Patient is from : Home  Anticipated discharge un:ynfz  Estimated DC date:tomorrow   Consultants:  Hematology/oncology  Procedures:None  Antimicrobials:  Anti-infectives (From admission, onward)    None       Subjective: Patient seen and examined at bedside today.  Hemodynamically stable.  Comfortable without any complaints.No abdominal discomfort  Objective: Vitals:   11/03/23 0447 11/03/23 1209 11/03/23 1928 11/04/23 0541  BP: 139/84 127/66 116/80 (!) 140/85  Pulse: (!) 51 (!) 56 (!) 53 (!) 50  Resp: 17 16 16 18   Temp: 97.7 F (36.5 C) 98.1 F (36.7 C) 98.2 F (36.8 C) (!) 97.4 F (36.3 C)  TempSrc: Oral Oral    SpO2: 96%  95% 94%  Weight:      Height:       No intake or output data in the 24 hours ending 11/04/23 1115  Filed Weights   11/01/23 2000 11/01/23 2143  Weight: 87.6 kg 84.5 kg    Examination:  General exam: Overall comfortable, not in distress HEENT: PERRL Respiratory system:  no wheezes or crackles  Cardiovascular system: S1 & S2 heard, RRR.  Gastrointestinal system: Abdomen is nondistended, soft and nontender. Central nervous system: Alert and oriented Extremities: No edema, no clubbing ,no cyanosis Skin: No rashes, no ulcers,no icterus         Data Reviewed: I have personally reviewed following labs and imaging studies  CBC: Recent Labs  Lab 10/29/23 1349 11/01/23 1713 11/02/23 0443 11/03/23 0438 11/04/23 0748  WBC 5.1 6.2 4.6 4.6 3.9*  NEUTROABS 3.2 3.7  --   --   --   HGB 14.5 15.0 14.4 14.5 14.7  HCT 45.9 47.3 44.9 46.2 47.1  MCV 77.1* 76.8* 76.8* 78.0* 77.1*  PLT 141.0* 150 112* 103* 101*   Basic Metabolic Panel: Recent Labs  Lab 10/29/23 1349 11/01/23 1713 11/02/23  9556 11/03/23 0438  NA 137 138 140 139  K 4.2 4.3 3.7 3.8  CL 103 102 103 103  CO2 28 24 26 25   GLUCOSE 98 104* 88 87  BUN 23 24* 17 15  CREATININE 0.88 0.96 0.93 0.95  CALCIUM  9.1 9.3 8.9 9.0     No results found for this or any previous visit (from the past 240 hours).   Radiology Studies: No results found.   Scheduled Meds:  losartan   50  mg Oral Daily   melatonin  5 mg Oral QHS   sodium chloride  flush  3 mL Intravenous Q12H   Continuous Infusions:  heparin 1,300 Units/hr (11/04/23 0422)     LOS: 2 days   Ivonne Mustache, MD [facility] P11/02/2023, 11:15 AM

## 2023-11-04 NOTE — Plan of Care (Signed)
   Problem: Education: Goal: Knowledge of General Education information will improve Description: Including pain rating scale, medication(s)/side effects and non-pharmacologic comfort measures Outcome: Progressing   Problem: Clinical Measurements: Goal: Will remain free from infection Outcome: Progressing

## 2023-11-04 NOTE — Progress Notes (Signed)
 PHARMACY - ANTICOAGULATION CONSULT NOTE  Pharmacy Consult for heparin Indication: portal vein thrombosis  Allergies  Allergen Reactions   Ansaid [Flurbiprofen] Other (See Comments)    Patients wife stated that he can not take any Ansaids   Aspirin Other (See Comments)   Dust Mite Extract     Other Reaction(s): Not available   Ibuprofen Other (See Comments)    REACTION: swelling neck and difficulty breathing   Atorvastatin  Other (See Comments)    MS side effects at 10 mg see notes 2023, muscle cramp    Patient Measurements: Height: 5' 9 (175.3 cm) Weight: 84.5 kg (186 lb 4.6 oz) IBW/kg (Calculated) : 70.7 HEPARIN DW (KG): 84.5  Vital Signs: Temp: 97.4 F (36.3 C) (11/02 0541) BP: 140/85 (11/02 0541) Pulse Rate: 50 (11/02 0541)  Labs: Recent Labs    11/01/23 1713 11/01/23 1713 11/01/23 2011 11/02/23 0443 11/02/23 1034 11/03/23 0438 11/04/23 0747 11/04/23 0748  HGB 15.0  --   --  14.4  --  14.5  --  14.7  HCT 47.3  --   --  44.9  --  46.2  --  47.1  PLT 150  --   --  112*  --  103*  --  101*  APTT  --   --  <22*  --   --   --   --   --   LABPROT  --   --  15.0  --   --   --   --   --   INR  --   --  1.1  --   --   --   --   --   HEPARINUNFRC  --    < >  --  0.47 0.55 0.56 0.59  --   CREATININE 0.96  --   --  0.93  --  0.95  --   --    < > = values in this interval not displayed.    Estimated Creatinine Clearance: 77.5 mL/min (by C-G formula based on SCr of 0.95 mg/dL).  Medications: No anticoagulants PTA  Assessment: Pt is a 70 yoM found to have extensive portal vein thrombosis. Pharmacy consulted to dose heparin.   Today, 11/04/23: Heparin level = 0.59 remains therapeutic on heparin infusion of 1300 units/hr CBC: Hgb WNL, Plt low but stable No bleeding or complications reported  Goal of Therapy:  Heparin level 0.3-0.7 units/ml Monitor platelets by anticoagulation protocol: Yes   Plan:  Continue heparin infusion at 1300 units/hr CBC, heparin level  daily Monitor for signs of bleeding  Ronal CHRISTELLA Rav, PharmD 11/04/23 8:29 AM

## 2023-11-04 NOTE — Plan of Care (Signed)
   Problem: Health Behavior/Discharge Planning: Goal: Ability to manage health-related needs will improve Outcome: Progressing   Problem: Clinical Measurements: Goal: Ability to maintain clinical measurements within normal limits will improve Outcome: Progressing

## 2023-11-05 ENCOUNTER — Other Ambulatory Visit (HOSPITAL_COMMUNITY): Payer: Self-pay

## 2023-11-05 DIAGNOSIS — I81 Portal vein thrombosis: Secondary | ICD-10-CM | POA: Diagnosis not present

## 2023-11-05 LAB — CBC
HCT: 44.4 % (ref 39.0–52.0)
Hemoglobin: 14.3 g/dL (ref 13.0–17.0)
MCH: 24.7 pg — ABNORMAL LOW (ref 26.0–34.0)
MCHC: 32.2 g/dL (ref 30.0–36.0)
MCV: 76.8 fL — ABNORMAL LOW (ref 80.0–100.0)
Platelets: 91 K/uL — ABNORMAL LOW (ref 150–400)
RBC: 5.78 MIL/uL (ref 4.22–5.81)
RDW: 14.7 % (ref 11.5–15.5)
WBC: 4.5 K/uL (ref 4.0–10.5)
nRBC: 0 % (ref 0.0–0.2)

## 2023-11-05 LAB — HEPARIN LEVEL (UNFRACTIONATED): Heparin Unfractionated: 0.63 [IU]/mL (ref 0.30–0.70)

## 2023-11-05 MED ORDER — APIXABAN 5 MG PO TABS
ORAL_TABLET | ORAL | 0 refills | Status: DC
  Filled 2023-11-05: qty 74, 30d supply, fill #0
  Filled 2023-11-30 (×2): qty 46, 23d supply, fill #1

## 2023-11-05 MED ORDER — APIXABAN 5 MG PO TABS: 5.0000 mg | ORAL_TABLET | Freq: Two times a day (BID) | ORAL | Status: DC

## 2023-11-05 MED ORDER — APIXABAN 5 MG PO TABS
10.0000 mg | ORAL_TABLET | Freq: Two times a day (BID) | ORAL | Status: DC
  Administered 2023-11-05: 10 mg via ORAL
  Filled 2023-11-05: qty 2

## 2023-11-05 NOTE — Discharge Instructions (Addendum)
 Information on my medicine - ELIQUIS (apixaban)  Why was Eliquis prescribed for you? Eliquis was prescribed to treat blood clots that may have been found in the veins of your legs (deep vein thrombosis) or in your lungs (pulmonary embolism) and to reduce the risk of them occurring again.  What do You need to know about Eliquis ? The starting dose is 10 mg (two 5 mg tablets) taken TWICE daily for the FIRST SEVEN (7) DAYS, then on 11/12/2023 (or as indicated by your starter kit),  the dose is reduced to ONE 5 mg tablet taken TWICE daily.  Eliquis may be taken with or without food.   Try to take the dose about the same time in the morning and in the evening. If you have difficulty swallowing the tablet whole please discuss with your pharmacist how to take the medication safely.  Take Eliquis exactly as prescribed and DO NOT stop taking Eliquis without talking to the doctor who prescribed the medication.  Stopping may increase your risk of developing a new blood clot.  Refill your prescription before you run out.  After discharge, you should have regular check-up appointments with your healthcare provider that is prescribing your Eliquis.    What do you do if you miss a dose? If a dose of ELIQUIS is not taken at the scheduled time, take it as soon as possible on the same day and twice-daily administration should be resumed. The dose should not be doubled to make up for a missed dose.  Important Safety Information A possible side effect of Eliquis is bleeding. You should call your healthcare provider right away if you experience any of the following: Bleeding from an injury or your nose that does not stop. Unusual colored urine (red or dark brown) or unusual colored stools (red or black). Unusual bruising for unknown reasons. A serious fall or if you hit your head (even if there is no bleeding).  Some medicines may interact with Eliquis and might increase your risk of bleeding or  clotting while on Eliquis. To help avoid this, consult your healthcare provider or pharmacist prior to using any new prescription or non-prescription medications, including herbals, vitamins, non-steroidal anti-inflammatory drugs (NSAIDs) and supplements.  This website has more information on Eliquis (apixaban): http://[email or web address].com/eliquis/home

## 2023-11-05 NOTE — Plan of Care (Signed)
   Problem: Education: Goal: Knowledge of General Education information will improve Description: Including pain rating scale, medication(s)/side effects and non-pharmacologic comfort measures Outcome: Progressing   Problem: Clinical Measurements: Goal: Will remain free from infection Outcome: Progressing

## 2023-11-05 NOTE — Discharge Summary (Addendum)
 Physician Discharge Summary  Jake Delgado FMW:[id number] DOB: 1955/12/05 DOA: 11/01/2023  PCP: Charlett Jake POUR, MD  Admit date: 11/01/2023 Discharge date: 11/05/2023  Admitted From: Home Disposition:  Home  Discharge Condition:Stable CODE STATUS:FULL Diet recommendation: Heart Healthy  Brief/Interim Summary: Patient is a 68 year old male with history of vitiligo, hypertension who presented with abdominal discomfort.  Discomfort described as a vague with appearance after eating.  He had an outpatient CT scan that was concerning for enlargement of pancreatic head, findings concerning for possible portal vein thrombosis.  No history of VTE.  On presentation, he was hemodynamically stable.  MRI showed extensive portal vein thrombosis without obvious cause.  Started on IV heparin.  Hospital course remained stable.  Anticoagulation/Eliquis.  Medically stable for discharge home today.  Following problems were addressed during the hospitalization:  Portal vein thrombosis: Complaint of vague abdominal discomfort after eating.  No nausea or vomiting.He had an outpatient CT scan that was concerning for enlargement of pancreatic head, findings concerning for possible portal vein thrombosis.  MRI done here showed extensive portal vein thrombosis ,no hepatobiliary or pancreatic abnormalities.  Etiology unclear.  Currently on heparin drip. Hypercoagulable work up,prothrombin mutation, Factor 5 leiden,protein C pending.Negative protein S, Antithrombin III, AFP and CA 19-9 . Consulted  hematology/oncology. Started on heparin drip.  Transition to Eliquis today.  He does not have any postprandial pain now.  He will follow-up with Dr. Timmy as an outpatient.   Hypertension: Losartan .  Currently blood pressure stable     Discharge Diagnoses:  Principal Problem:   Portal vein thrombosis Active Problems:   Essential hypertension    Discharge Instructions  Discharge Instructions     Diet - low  sodium heart healthy   Complete by: As directed    Discharge instructions   Complete by: As directed    1)Please take your medications as instructed 2)You will be called by oncology for follow up appointment   Increase activity slowly   Complete by: As directed       Allergies as of 11/05/2023       Reactions   Ansaid [flurbiprofen] Other (See Comments)   Patients wife stated that he can not take any Ansaids   Aspirin Other (See Comments)   Dust Mite Extract    Other Reaction(s): Not available   Ibuprofen Other (See Comments)   REACTION: swelling neck and difficulty breathing   Atorvastatin  Other (See Comments)   MS side effects at 10 mg see notes 2023, muscle cramp        Medication List     TAKE these medications    apixaban 5 MG Tabs tablet Commonly known as: ELIQUIS Take 2 tablets (10 mg total) by mouth 2 (two) times daily for 7 days, THEN 1 tablet (5 mg total) 2 (two) times daily. Start taking on: November 05, 2023   azithromycin 250 MG tablet Commonly known as: ZITHROMAX Take 250 mg by mouth See admin instructions. Patient is taking this prescription to [place] in case of infection   levocetirizine 5 MG tablet Commonly known as: XYZAL Take 5 mg by mouth every evening.   losartan  50 MG tablet Commonly known as: COZAAR  TAKE 1 TABLET BY MOUTH EVERY DAY        Follow-up Information     [name], Jake POUR, MD. Schedule an appointment as soon as possible for a visit in 1 week(s).   Specialties: Internal Medicine, Pediatrics Contact information: [address] [place] [address] [phone]  Jake Maude SAUNDERS, MD. Schedule an appointment as soon as possible for a visit in 2 week(s).   Specialty: Oncology Contact information: [address] [place] [address] [phone]                Allergies  Allergen Reactions   Ansaid [Flurbiprofen] Other (See Comments)    Patients wife stated that he can not take any  Ansaids   Aspirin Other (See Comments)   Dust Mite Extract     Other Reaction(s): Not available   Ibuprofen Other (See Comments)    REACTION: swelling neck and difficulty breathing   Atorvastatin  Other (See Comments)    MS side effects at 10 mg see notes 2023, muscle cramp    Consultations: Oncology   Procedures/Studies: MR ABDOMEN MRCP W WO CONTAST Result Date: 11/01/2023 EXAM: MRCP WITHOUT AND WITH IV CONTRAST 11/01/2023 07:05:57 PM TECHNIQUE: Multisequence, multiplanar magnetic resonance images of the abdomen without and with intravenous contrast. MRCP sequences were performed. 9 mL Gadobutrol (GADAVIST) 1 MMOL/ML injection. COMPARISON: CT scan 10/30/2023 CLINICAL HISTORY: Epigastric abdominal pain  Abnormal CT scan. FINDINGS: LIVER: No hepatic lesions. No intrahepatic biliary dilatation. GALLBLADDER AND BILIARY SYSTEM: Gallbladder is unremarkable. No gallstones or findings for acute cholecystitis. Normal caliber and course of the common bile duct. No intrahepatic or extrahepatic ductal dilation. SPLEEN: The spleen is normal in size. No focal lesions. PANCREAS/PANCREATIC DUCT: The pancreas is unremarkable. No findings for acute pancreatitis. No mass or ductal dilatation. ADRENAL GLANDS: The adrenal glands are normal. KIDNEYS: No renal lesions or hydronephrosis. Small bilateral renal cysts not requiring any further imaging evaluation for follow-up. LYMPH NODES: No mesenteric or retroperitoneal mass or adenopathy. VASCULATURE: Fairly extensive portal vein thrombosis extending into the main portal vein. Periportal collateral vessels are noted. The aorta and branch vessels are patent. No aneurysm or dissection. The major venous structures are patent except for the portal vein. PERITONEUM: No ascites. ABDOMINAL WALL: No abdominal wall hernia. No mass. BOWEL: The stomach, duodenum, visualized small bowel, and visualized colon are unremarkable. No bowel obstruction. BONES: The bony structures are  unremarkable. SOFT TISSUES: Unremarkable. MISCELLANEOUS: The lung bases are clear. No pleural or pericardial effusion. No pulmonary infiltrates. IMPRESSION: 1. Extensive portal vein thrombosis extending into the main portal vein with periportal collateral vessels. No obvious cause. 2. No hepatic, bilirary or pancreatic abnormalities. Electronically signed by: Maude Stammer MD 11/01/2023 07:34 PM EDT RP Workstation: [id number]   MR 3D Recon At Scanner Result Date: 11/01/2023 EXAM: MRCP WITHOUT AND WITH IV CONTRAST 11/01/2023 07:05:57 PM TECHNIQUE: Multisequence, multiplanar magnetic resonance images of the abdomen without and with intravenous contrast. MRCP sequences were performed. 9 mL Gadobutrol (GADAVIST) 1 MMOL/ML injection. COMPARISON: CT scan 10/30/2023 CLINICAL HISTORY: Epigastric abdominal pain  Abnormal CT scan. FINDINGS: LIVER: No hepatic lesions. No intrahepatic biliary dilatation. GALLBLADDER AND BILIARY SYSTEM: Gallbladder is unremarkable. No gallstones or findings for acute cholecystitis. Normal caliber and course of the common bile duct. No intrahepatic or extrahepatic ductal dilation. SPLEEN: The spleen is normal in size. No focal lesions. PANCREAS/PANCREATIC DUCT: The pancreas is unremarkable. No findings for acute pancreatitis. No mass or ductal dilatation. ADRENAL GLANDS: The adrenal glands are normal. KIDNEYS: No renal lesions or hydronephrosis. Small bilateral renal cysts not requiring any further imaging evaluation for follow-up. LYMPH NODES: No mesenteric or retroperitoneal mass or adenopathy. VASCULATURE: Fairly extensive portal vein thrombosis extending into the main portal vein. Periportal collateral vessels are noted. The aorta and branch vessels are patent. No aneurysm or dissection.  The major venous structures are patent except for the portal vein. PERITONEUM: No ascites. ABDOMINAL WALL: No abdominal wall hernia. No mass. BOWEL: The stomach, duodenum, visualized small bowel, and  visualized colon are unremarkable. No bowel obstruction. BONES: The bony structures are unremarkable. SOFT TISSUES: Unremarkable. MISCELLANEOUS: The lung bases are clear. No pleural or pericardial effusion. No pulmonary infiltrates. IMPRESSION: 1. Extensive portal vein thrombosis extending into the main portal vein with periportal collateral vessels. No obvious cause. 2. No hepatic, bilirary or pancreatic abnormalities. Electronically signed by: Maude Stammer MD 11/01/2023 07:34 PM EDT RP Workstation: [id number]   CT ABDOMEN PELVIS WO CONTRAST Result Date: 10/30/2023 EXAM: CT ABDOMEN AND PELVIS WITHOUT CONTRAST 10/30/2023 11:23:09 AM TECHNIQUE: CT of the abdomen and pelvis was performed without the administration of intravenous contrast. Multiplanar reformatted images are provided for review. Automated exposure control, iterative reconstruction, and/or weight-based adjustment of the mA/kV was utilized to reduce the radiation dose to as low as reasonably achievable. COMPARISON: 11/01/2020 CLINICAL HISTORY: worsening epigastric pain with radiation into R testicle x 2-3 months. initially with emesis. Patient thinks he pulled a muscle on 08/02/23; Having continued pain and discomfort in upper abd, epigastric area ; As well as pain in RT scrotal area, RT testicle; Not sure if any of this is related to each other ; Hx HTN on meds FINDINGS: LOWER CHEST: No acute abnormality. LIVER: Ill-defined high density is seen in the region of the portal vein suggesting possible portal vein thrombosis. Possible large collateral vein seen in the porta hepatis region. GALLBLADDER AND BILE DUCTS: Gallbladder is unremarkable. No biliary ductal dilatation. SPLEEN: No acute abnormality. PANCREAS: Pancreatic head enlargement is noted concerning for either acute pancreatitis or possibly underlying neoplasm. Further evaluation with MRI is recommended. ADRENAL GLANDS: No acute abnormality. KIDNEYS, URETERS AND BLADDER: Small nonobstructive  right renal calculus. No stones in the left kidney or ureters. No hydronephrosis. No perinephric or periureteral stranding. Urinary bladder is unremarkable. GI AND BOWEL: Stomach demonstrates no acute abnormality. There is no bowel obstruction. PERITONEUM AND RETROPERITONEUM: No ascites. No free air. VASCULATURE: Aortic atherosclerosis. Aorta is normal in caliber. LYMPH NODES: No lymphadenopathy. REPRODUCTIVE ORGANS: No acute abnormality. BONES AND SOFT TISSUES: No acute osseous abnormality. No focal soft tissue abnormality. IMPRESSION: 1. Pancreatic head enlargement, differential includes acute pancreatitis versus underlying neoplasm. Recommend MRI of the abdomen with and without contrast for further evaluation. 2. Ill-defined high density in the region of the portal vein, suspicious for portal vein thrombosis. Recommend Doppler ultrasound or contrast-enhanced CT/MR venography for confirmation. 3. Small nonobstructive right renal calculus. No specific imaging follow-up required unless symptomatic. Electronically signed by: Lynwood Seip MD 10/30/2023 11:52 AM EDT RP Workstation: [id number]      Subjective: Patient seen and examined at bedside today.  Hemodynamically stable.  Overall comfortable.  No new complaints.  Medically stable for discharge today.  Discharge Exam: Vitals:   11/04/23 2016 11/05/23 0432  BP: 117/74 135/80  Pulse: (!) 53 (!) 49  Resp: 16 15  Temp: 97.9 F (36.6 C) 97.6 F (36.4 C)  SpO2: 96% 98%   Vitals:   11/04/23 0541 11/04/23 1309 11/04/23 2016 11/05/23 0432  BP: (!) 140/85 119/74 117/74 135/80  Pulse: (!) 50 (!) 59 (!) 53 (!) 49  Resp: 18  16 15   Temp: (!) 97.4 F (36.3 C) (!) 97.5 F (36.4 C) 97.9 F (36.6 C) 97.6 F (36.4 C)  TempSrc:  Oral Oral Oral  SpO2: 94% 98% 96% 98%  Weight:  Height:        General: Pt is alert, awake, not in acute distress Cardiovascular: RRR, S1/S2 +, no rubs, no gallops Respiratory: CTA bilaterally, no wheezing, no  rhonchi Abdominal: Soft, NT, ND, bowel sounds + Extremities: no edema, no cyanosis    The results of significant diagnostics from this hospitalization (including imaging, microbiology, ancillary and laboratory) are listed below for reference.     Microbiology: No results found for this or any previous visit (from the past 240 hours).   Labs: BNP (last 3 results) No results for input(s): BNP in the last 8760 hours. Basic Metabolic Panel: Recent Labs  Lab 11/01/23 1713 11/02/23 0443 11/03/23 0438  NA 138 140 139  K 4.3 3.7 3.8  CL 102 103 103  CO2 24 26 25   GLUCOSE 104* 88 87  BUN 24* 17 15  CREATININE 0.96 0.93 0.95  CALCIUM  9.3 8.9 9.0   Liver Function Tests: Recent Labs  Lab 11/01/23 1713  AST 28  ALT 23  ALKPHOS 94  BILITOT 0.7  PROT 7.2  ALBUMIN 4.2   No results for input(s): LIPASE, AMYLASE in the last 168 hours. No results for input(s): AMMONIA in the last 168 hours. CBC: Recent Labs  Lab 11/01/23 1713 11/02/23 0443 11/03/23 0438 11/04/23 0748 11/05/23 0411  WBC 6.2 4.6 4.6 3.9* 4.5  NEUTROABS 3.7  --   --   --   --   HGB 15.0 14.4 14.5 14.7 14.3  HCT 47.3 44.9 46.2 47.1 44.4  MCV 76.8* 76.8* 78.0* 77.1* 76.8*  PLT 150 112* 103* 101* 91*   Cardiac Enzymes: No results for input(s): CKTOTAL, CKMB, CKMBINDEX, TROPONINI in the last 168 hours. BNP: Invalid input(s): POCBNP CBG: No results for input(s): GLUCAP in the last 168 hours. D-Dimer No results for input(s): DDIMER in the last 72 hours. Hgb A1c No results for input(s): HGBA1C in the last 72 hours. Lipid Profile No results for input(s): CHOL, HDL, LDLCALC, TRIG, CHOLHDL, LDLDIRECT in the last 72 hours. Thyroid  function studies No results for input(s): TSH, T4TOTAL, T3FREE, THYROIDAB in the last 72 hours.  Invalid input(s): FREET3 Anemia work up No results for input(s): VITAMINB12, FOLATE, FERRITIN, TIBC, IRON, RETICCTPCT in the  last 72 hours. Urinalysis    Component Value Date/Time   COLORURINE YELLOW 06/07/2013 0022   APPEARANCEUR CLEAR 06/07/2013 0022   LABSPEC 1.021 06/07/2013 0022   PHURINE 6.0 06/07/2013 0022   GLUCOSEU NEGATIVE 06/07/2013 0022   HGBUR NEGATIVE 06/07/2013 0022   BILIRUBINUR neg 10/29/2023 1402   KETONESUR NEGATIVE 06/07/2013 0022   PROTEINUR Negative 10/29/2023 1402   PROTEINUR NEGATIVE 06/07/2013 0022   UROBILINOGEN 0.2 10/29/2023 1402   UROBILINOGEN 0.2 06/07/2013 0022   NITRITE neg 10/29/2023 1402   NITRITE NEGATIVE 06/07/2013 0022   LEUKOCYTESUR Negative 10/29/2023 1402   Sepsis Labs Recent Labs  Lab 11/02/23 0443 11/03/23 0438 11/04/23 0748 11/05/23 0411  WBC 4.6 4.6 3.9* 4.5   Microbiology No results found for this or any previous visit (from the past 240 hours).  Please note: You were cared for by a hospitalist during your hospital stay. Once you are discharged, your primary care physician will handle any further medical issues. Please note that NO REFILLS for any discharge medications will be authorized once you are discharged, as it is imperative that you return to your primary care physician (or establish a relationship with a primary care physician if you do not have one) for your post hospital discharge needs so that they  can reassess your need for medications and monitor your lab values.    Time coordinating discharge: 40 minutes  SIGNED:   Ivonne Mustache, MD  [facility] 11/05/2023, 1:29 PM Pager [phone]  If 7PM-7AM, please contact night-coverage [email or web address] Password [email or web address]

## 2023-11-05 NOTE — TOC Initial Note (Signed)
 Transition of Care [facility]) - Initial/Assessment Note    Patient Details  Name: Jake Delgado MRN: [id number] Date of Birth: 1955-08-04  [facility] CM/SW Contact:    Doneta Glenys DASEN, RN Phone Number: 11/05/2023, 12:49 PM  Clinical Narrative:                 Presented with dull abdominal pain and sent by MD. CM introduced and explained role. PTA states lives in a house with wife who is currently out of the [place]. DME-cane;Denies HH, oxygen, or SDOH needs. Patient states that Odis Chancy (co-worker) [phone] is an emergency contact and transporter home at discharge. No inpatient care management needs identified during visit. Place consult if needs present.    Expected Discharge Plan: Home/Self Care Barriers to Discharge: No Barriers Identified   Patient Goals and CMS Choice Patient states their goals for this hospitalization and ongoing recovery are:: Home CMS Medicare.gov Compare Post Acute Care list provided to::  (NA) Choice offered to / list presented to : NA [place] ownership interest in [facility].provided to:: Parent NA    Expected Discharge Plan and Services In-house Referral: NA Discharge Planning Services: CM Consult Post Acute Care Choice: NA Living arrangements for the past 2 months: Single Family Home                 DME Arranged: N/A DME Agency: NA       HH Arranged: NA HH Agency: NA        Prior Living Arrangements/Services Living arrangements for the past 2 months: Single Family Home Lives with:: Spouse Patient language and need for interpreter reviewed:: Yes Do you feel safe going back to the place where you live?: Yes      Need for Family Participation in Patient Care: No (Comment) Care giver support system in place?: Yes (comment) Current home services: DME (cane) Criminal Activity/Legal Involvement Pertinent to Current Situation/Hospitalization: No - Comment as needed  Activities of Daily Living   ADL Screening  (condition at time of admission) Independently performs ADLs?: Yes (appropriate for developmental age) Is the patient deaf or have difficulty hearing?: No Does the patient have difficulty seeing, even when wearing glasses/contacts?: No Does the patient have difficulty concentrating, remembering, or making decisions?: No  Permission Sought/Granted Permission sought to share information with : Case Manager Permission granted to share information with : Yes, Verbal Permission Granted  Share Information with NAME: Odis Chancy (co-worker) [phone]           Emotional Assessment Appearance:: Appears stated age Attitude/Demeanor/Rapport: Engaged Affect (typically observed): Appropriate Orientation: : Oriented to Self, Oriented to Place, Oriented to  Time, Oriented to Situation Alcohol / Substance Use: Not Applicable Psych Involvement: No (comment)  Admission diagnosis:  Portal vein thrombosis [I81] Patient Active Problem List   Diagnosis Date Noted   Portal vein thrombosis 11/01/2023   Essential hypertension 08/04/2016   Family history of diabetes mellitus 01/18/2014   Elevated blood sugar 11/16/2013   Elevated blood pressure reading 11/16/2013   Vitiligo    Pulmonary infiltrates 07/09/2013   PCP:  Charlett [name], MD Pharmacy:   [phone] - [place], [place] - [address] AT [address] OF [facility] [address] [place] [address] Phone: [phone] Fax: [phone]     Social Drivers of Health (SDOH) Social History: SDOH Screenings   Food Insecurity: No Food Insecurity (11/01/2023)  Housing: Low Risk  (11/01/2023)  Transportation Needs: No Transportation Needs (11/01/2023)  Utilities: Not  At Risk (11/01/2023)  Alcohol Screen: Low Risk  (09/05/2023)  Depression (PHQ2-9): Low Risk  (09/05/2023)  Financial Resource Strain: Low Risk  (09/04/2023)  Physical Activity: Insufficiently Active (09/04/2023)  Social Connections: Unknown (11/01/2023)   Stress: No Stress Concern Present (09/04/2023)  Tobacco Use: Low Risk  (11/01/2023)   SDOH Interventions:     Readmission Risk Interventions    11/05/2023   12:45 PM  Readmission Risk Prevention Plan  Post Dischage Appt Complete  Medication Screening Complete  Transportation Screening Complete

## 2023-11-05 NOTE — Progress Notes (Signed)
 PIV removed as noted, patient dressing for discharge to home- ride enroute. AVS reviewed w/ patient who verbalizd an understanding. Discharge med in a secure bag delivered to patient in room by this RN

## 2023-11-05 NOTE — Progress Notes (Signed)
 PHARMACY - ANTICOAGULATION CONSULT NOTE  Pharmacy Consult for heparin Indication: portal vein thrombosis  Allergies  Allergen Reactions   Ansaid [Flurbiprofen] Other (See Comments)    Patients wife stated that he can not take any Ansaids   Aspirin Other (See Comments)   Dust Mite Extract     Other Reaction(s): Not available   Ibuprofen Other (See Comments)    REACTION: swelling neck and difficulty breathing   Atorvastatin  Other (See Comments)    MS side effects at 10 mg see notes 2023, muscle cramp    Patient Measurements: Height: 5' 9 (175.3 cm) Weight: 84.5 kg (186 lb 4.6 oz) IBW/kg (Calculated) : 70.7 HEPARIN DW (KG): 84.5  Vital Signs: Temp: 97.6 F (36.4 C) (11/03 0432) Temp Source: Oral (11/03 0432) BP: 135/80 (11/03 0432) Pulse Rate: 49 (11/03 0432)  Labs: Recent Labs    11/03/23 0438 11/04/23 0747 11/04/23 0748 11/05/23 0411  HGB 14.5  --  14.7 14.3  HCT 46.2  --  47.1 44.4  PLT 103*  --  101* 91*  HEPARINUNFRC 0.56 0.59  --  0.63  CREATININE 0.95  --   --   --     Estimated Creatinine Clearance: 77.5 mL/min (by C-G formula based on SCr of 0.95 mg/dL).  Medications: No anticoagulants PTA  Assessment: Pt is a 62 yoM found to have extensive portal vein thrombosis. Pharmacy consulted to dose heparin.   Today, 11/05/23: Heparin level remains therapeutic on heparin infusion of 1300 units/hr CBC: Hgb WNL, Plt low, trending down slowly No bleeding or complications reported  Goal of Therapy:  Heparin level 0.3-0.7 units/ml Monitor platelets by anticoagulation protocol: Yes   Plan:  Continue heparin infusion at 1300 units/hr CBC, heparin level daily Monitor for signs of bleeding   Eva CHRISTELLA Allis, PharmD, BCPS Secure Chat if ?s 11/05/2023 7:31 AM

## 2023-11-05 NOTE — Progress Notes (Cosign Needed)
 Jake Delgado   DOB:[birth year]   FM#:[id number]      ASSESSMENT & PLAN:  Jake Delgado is a 68 year old male patient who was admitted on 11/01/2023 with complaints of abdominal discomfort.  MRI abdomen showed extensive portal vein thrombosis. Hematology following.   Extensive portal vein thrombosis  --CT scan abdomen done 10/30/2023 shows pancreatic head enlargement and ill-defined high density region in the portal vein, suspicious for portal vein thrombosis.  Therefore MRI was recommended - MR abdomen done 11/02/2023 shows extensive portal vein thrombosis extending into the main portal vein with periportal collateral vessels. -- Pending labs: Factor V Leiden, protein C, prothrombin gene mutation, -- AFP tumor marker is WNL and CA 19-9 is WNL, Protein S is WNL.  No malignancy identified to date.  -- IV heparin infusing, okay from hematology viewpoint to transition to Eliquis today.  Will likely need anticoaguation long-term.  -- Okay for discharge from Heme point of view and follow with outpatient.  --Monitor closely for bleeding -- Hematology/Dr. Timmy following and will see patient as outpatient upon discharge. Patient agreeable to plan as outlined.   Abdominal discomfort -- Improved significantly -- Secondary to thrombosis  Thrombocytopenia -- Mild -- Platelets slightly lower 91K today  -- Denies any bleeding. Monitor closely for bleeding.  -- No intervention required   Erythrocytosis -- Elevated RBC 5.85 and  Hemoglobin 14.4 on admission.  RBC 5.78 with HGB 14.3 today.  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  Reports that he feels well and has no discomfort.  IV heparin infusing well. Patient agreeable to DOAC and to return to  outpatient hematology for further care upon discharge. Very pleasant.   Objective:  No intake or output data in the 24 hours ending 11/05/23 1207   PHYSICAL EXAMINATION: ECOG PERFORMANCE STATUS: 1 - Symptomatic but completely ambulatory  Vitals:   11/04/23 2016 11/05/23 0432  BP: 117/74 135/80  Pulse: (!) 53 (!) 49  Resp: 16 15  Temp: 97.9 F (36.6 C) 97.6 F (36.4 C)  SpO2: 96% 98%   Filed Weights   11/01/23 2000 11/01/23 2143  Weight: 193 lb 3.2 oz (87.6 kg) 186 lb 4.6 oz (84.5 kg)    GENERAL: alert, no distress and comfortable SKIN: skin color, texture, turgor are normal, no rashes or significant lesions EYES: normal, conjunctiva are pink and non-injected, sclera clear OROPHARYNX: no exudate, no erythema and lips, buccal mucosa, and tongue normal  NECK: supple, thyroid  normal size, non-tender, without nodularity LYMPH: no palpable lymphadenopathy in the cervical, axillary or inguinal LUNGS: clear to auscultation and percussion with normal breathing effort HEART: regular rate & rhythm and no murmurs and no lower extremity edema ABDOMEN: abdomen soft, non-tender and normal bowel sounds MUSCULOSKELETAL: no cyanosis of digits and no clubbing  PSYCH: alert & oriented x 3 with fluent speech NEURO: no focal motor/sensory deficits   All questions were answered. The patient knows to call the clinic with any problems, questions or concerns.   The total time spent in the appointment was 40 minutes encounter with patient including review of chart and various tests results, discussions about plan of care and coordination of care plan  Olam JINNY Brunner, NP 11/05/2023 12:07 PM  Labs Reviewed:  Lab Results  Component Value Date   WBC 4.5 11/05/2023   HGB 14.3 11/05/2023   HCT 44.4 11/05/2023   MCV 76.8 (L) 11/05/2023   PLT 91 (L) 11/05/2023   Recent Labs    09/05/23 1142 10/29/23 1349 11/01/23 1713 11/02/23 0443 11/03/23 0438  NA 138 137 138 140 139  K 4.0 4.2 4.3 3.7  3.8  CL 104 103 102 103 103  CO2 26 28 24 26 25   GLUCOSE 85 98 104* 88 87  BUN 21 23 24* 17 15  CREATININE 0.87 0.88 0.96 0.93 0.95  CALCIUM  8.8 9.1 9.3 8.9 9.0  GFRNONAA  --   --  >60 >60 >60  PROT 7.0 7.3 7.2  --   --   ALBUMIN 4.1 4.2 4.2  --   --   AST 17 19 28   --   --   ALT 23 21 23   --   --   ALKPHOS 69 74 94  --   --   BILITOT 1.1 0.7 0.7  --   --     Studies Reviewed:  MR ABDOMEN MRCP W WO CONTAST Result Date: 11/01/2023 EXAM: MRCP WITHOUT AND WITH IV CONTRAST 11/01/2023 07:05:57 PM TECHNIQUE: Multisequence, multiplanar magnetic resonance images of the abdomen without and with intravenous contrast. MRCP sequences were performed. 9 mL Gadobutrol (GADAVIST) 1 MMOL/ML injection. COMPARISON: CT scan 10/30/2023 CLINICAL HISTORY: Epigastric abdominal pain  Abnormal CT scan. FINDINGS: LIVER: No hepatic lesions. No intrahepatic biliary dilatation. GALLBLADDER AND BILIARY SYSTEM: Gallbladder is unremarkable. No gallstones or findings for acute cholecystitis. Normal caliber and course of the common bile duct. No intrahepatic or extrahepatic ductal dilation. SPLEEN: The spleen is normal in size. No focal lesions. PANCREAS/PANCREATIC DUCT: The pancreas is unremarkable. No findings for acute pancreatitis. No mass or ductal dilatation. ADRENAL GLANDS: The adrenal glands are normal. KIDNEYS: No renal lesions or hydronephrosis. Small bilateral renal cysts not requiring any further imaging evaluation for follow-up. LYMPH NODES: No mesenteric or retroperitoneal mass or adenopathy. VASCULATURE: Fairly extensive portal vein thrombosis extending into the main portal vein. Periportal collateral vessels are noted. The aorta and branch vessels are patent. No aneurysm or dissection. The major venous structures are patent except for the portal vein. PERITONEUM: No ascites. ABDOMINAL WALL: No abdominal wall hernia. No mass. BOWEL: The stomach, duodenum, visualized small bowel, and visualized colon are unremarkable.  No bowel obstruction. BONES: The bony structures are unremarkable. SOFT TISSUES: Unremarkable. MISCELLANEOUS: The lung bases are clear. No pleural or pericardial effusion. No pulmonary infiltrates. IMPRESSION: 1. Extensive portal vein thrombosis extending into the main portal vein with periportal collateral vessels. No obvious cause. 2. No hepatic, bilirary or pancreatic abnormalities. Electronically signed by: Maude Stammer MD 11/01/2023 07:34 PM EDT RP Workstation: [id number]   MR 3D Recon At Scanner Result Date: 11/01/2023 EXAM: MRCP WITHOUT AND WITH IV CONTRAST 11/01/2023 07:05:57 PM TECHNIQUE: Multisequence, multiplanar magnetic resonance images of the abdomen without and with intravenous contrast. MRCP sequences were performed. 9 mL Gadobutrol (GADAVIST) 1 MMOL/ML injection. COMPARISON: CT scan 10/30/2023 CLINICAL HISTORY: Epigastric abdominal pain  Abnormal CT scan. FINDINGS: LIVER: No hepatic lesions. No intrahepatic biliary dilatation. GALLBLADDER AND BILIARY SYSTEM: Gallbladder is unremarkable. No gallstones or findings for acute cholecystitis. Normal caliber and course of the common bile duct. No intrahepatic or extrahepatic ductal dilation. SPLEEN: The spleen is normal in size. No focal lesions. PANCREAS/PANCREATIC DUCT: The pancreas is unremarkable. No findings for acute pancreatitis. No mass or ductal dilatation. ADRENAL GLANDS:  The adrenal glands are normal. KIDNEYS: No renal lesions or hydronephrosis. Small bilateral renal cysts not requiring any further imaging evaluation for follow-up. LYMPH NODES: No mesenteric or retroperitoneal mass or adenopathy. VASCULATURE: Fairly extensive portal vein thrombosis extending into the main portal vein. Periportal collateral vessels are noted. The aorta and branch vessels are patent. No aneurysm or dissection. The major venous structures are patent except for the portal vein. PERITONEUM: No ascites. ABDOMINAL WALL: No abdominal wall hernia. No mass. BOWEL:  The stomach, duodenum, visualized small bowel, and visualized colon are unremarkable. No bowel obstruction. BONES: The bony structures are unremarkable. SOFT TISSUES: Unremarkable. MISCELLANEOUS: The lung bases are clear. No pleural or pericardial effusion. No pulmonary infiltrates. IMPRESSION: 1. Extensive portal vein thrombosis extending into the main portal vein with periportal collateral vessels. No obvious cause. 2. No hepatic, bilirary or pancreatic abnormalities. Electronically signed by: Maude Stammer MD 11/01/2023 07:34 PM EDT RP Workstation: [id number]   CT ABDOMEN PELVIS WO CONTRAST Result Date: 10/30/2023 EXAM: CT ABDOMEN AND PELVIS WITHOUT CONTRAST 10/30/2023 11:23:09 AM TECHNIQUE: CT of the abdomen and pelvis was performed without the administration of intravenous contrast. Multiplanar reformatted images are provided for review. Automated exposure control, iterative reconstruction, and/or weight-based adjustment of the mA/kV was utilized to reduce the radiation dose to as low as reasonably achievable. COMPARISON: 11/01/2020 CLINICAL HISTORY: worsening epigastric pain with radiation into R testicle x 2-3 months. initially with emesis. Patient thinks he pulled a muscle on 08/02/23; Having continued pain and discomfort in upper abd, epigastric area ; As well as pain in RT scrotal area, RT testicle; Not sure if any of this is related to each other ; Hx HTN on meds FINDINGS: LOWER CHEST: No acute abnormality. LIVER: Ill-defined high density is seen in the region of the portal vein suggesting possible portal vein thrombosis. Possible large collateral vein seen in the porta hepatis region. GALLBLADDER AND BILE DUCTS: Gallbladder is unremarkable. No biliary ductal dilatation. SPLEEN: No acute abnormality. PANCREAS: Pancreatic head enlargement is noted concerning for either acute pancreatitis or possibly underlying neoplasm. Further evaluation with MRI is recommended. ADRENAL GLANDS: No acute abnormality.  KIDNEYS, URETERS AND BLADDER: Small nonobstructive right renal calculus. No stones in the left kidney or ureters. No hydronephrosis. No perinephric or periureteral stranding. Urinary bladder is unremarkable. GI AND BOWEL: Stomach demonstrates no acute abnormality. There is no bowel obstruction. PERITONEUM AND RETROPERITONEUM: No ascites. No free air. VASCULATURE: Aortic atherosclerosis. Aorta is normal in caliber. LYMPH NODES: No lymphadenopathy. REPRODUCTIVE ORGANS: No acute abnormality. BONES AND SOFT TISSUES: No acute osseous abnormality. No focal soft tissue abnormality. IMPRESSION: 1. Pancreatic head enlargement, differential includes acute pancreatitis versus underlying neoplasm. Recommend MRI of the abdomen with and without contrast for further evaluation. 2. Ill-defined high density in the region of the portal vein, suspicious for portal vein thrombosis. Recommend Doppler ultrasound or contrast-enhanced CT/MR venography for confirmation. 3. Small nonobstructive right renal calculus. No specific imaging follow-up required unless symptomatic. Electronically signed by: Lynwood Seip MD 10/30/2023 11:52 AM EDT RP Workstation: [id number]

## 2023-11-06 ENCOUNTER — Ambulatory Visit: Admitting: Internal Medicine

## 2023-11-06 ENCOUNTER — Encounter: Payer: Self-pay | Admitting: Internal Medicine

## 2023-11-06 ENCOUNTER — Telehealth: Payer: Self-pay

## 2023-11-06 VITALS — BP 112/78 | HR 56 | Temp 97.8°F | Ht 69.0 in | Wt 190.6 lb

## 2023-11-06 DIAGNOSIS — Z7901 Long term (current) use of anticoagulants: Secondary | ICD-10-CM

## 2023-11-06 DIAGNOSIS — Z09 Encounter for follow-up examination after completed treatment for conditions other than malignant neoplasm: Secondary | ICD-10-CM

## 2023-11-06 DIAGNOSIS — Z79899 Other long term (current) drug therapy: Secondary | ICD-10-CM

## 2023-11-06 DIAGNOSIS — I81 Portal vein thrombosis: Secondary | ICD-10-CM | POA: Diagnosis not present

## 2023-11-06 NOTE — Patient Instructions (Addendum)
 Good to see you today   Glad  there is no evidence of underlying  significant disease.  No obvious   liver disease either.  Continue anticoagulant and make appt with Dr Timmy and team .  Plan fu  in 2-3 months.  Or as needed.

## 2023-11-06 NOTE — Transitions of Care (Post Inpatient/ED Visit) (Signed)
   11/06/2023  Name: Jake Delgado MRN: [id number] DOB: 02/16/1955  Today's TOC FU Call Status: Today's TOC FU Call Status:: Successful TOC FU Call Completed TOC FU Call Complete Date: 11/06/23 Patient's Name and Date of Birth confirmed.  Transition Care Management Follow-up Telephone Call Date of Discharge: 11/05/23 Discharge Facility: [facility]) Type of Discharge: Inpatient Admission Primary Inpatient Discharge Diagnosis:: thrombosis How have you been since you were released from the hospital?: Better Any questions or concerns?: No  Items Reviewed: Did you receive and understand the discharge instructions provided?: Yes Medications obtained,verified, and reconciled?: Yes (Medications Reviewed) Any new allergies since your discharge?: No Dietary orders reviewed?: Yes Do you have support at home?: No  Medications Reviewed Today: Medications Reviewed Today     Reviewed by Emmitt Pan, LPN (Licensed Practical Nurse) on 11/06/23 at 0945  Med List Status: <None>   Medication Order Taking? Sig Documenting Provider Last Dose Status Informant  apixaban (ELIQUIS) 5 MG TABS tablet [id number] Yes Take 2 tablets (10 mg total) by mouth 2 (two) times daily for 7 days, THEN 1 tablet (5 mg total) 2 (two) times daily. Jillian Buttery, MD  Active   azithromycin (ZITHROMAX) 250 MG tablet [id number]  Take 250 mg by mouth See admin instructions. Patient is taking this prescription to [place] in case of infection [provider]  Active   levocetirizine (XYZAL) 5 MG tablet [id number] Yes Take 5 mg by mouth every evening. [provider]  Active   losartan  (COZAAR ) 50 MG tablet [id number] Yes TAKE 1 TABLET BY MOUTH EVERY DAY [name], Wanda K, MD  Active             Home Care and Equipment/Supplies: Were Home Health Services Ordered?: NA Any new equipment or medical supplies ordered?: NA  Functional Questionnaire: Do you need assistance with bathing/showering or dressing?:  No Do you need assistance with meal preparation?: No Do you need assistance with eating?: No Do you have difficulty maintaining continence: No Do you need assistance with getting out of bed/getting out of a chair/moving?: No Do you have difficulty managing or taking your medications?: No  Follow up appointments reviewed: PCP Follow-up appointment confirmed?: Yes Date of PCP follow-up appointment?: 11/06/23 Follow-up Provider: [facility] Follow-up appointment confirmed?: NA Do you need transportation to your follow-up appointment?: No Do you understand care options if your condition(s) worsen?: Yes-patient verbalized understanding    SIGNATURE Pan Emmitt, LPN [facility] Nurse Health Advisor Direct Dial [phone]

## 2023-11-06 NOTE — Progress Notes (Signed)
 Chief Complaint  Patient presents with   Hospitalization Follow-up    Pt reports he no longer feels discomfort on his abdomen but still feeling  little discomfort on testicle area.     HPI: Jake Delgado 68 y.o. come in for [facility] for  idopathic  portal vein thronbosis   rx with iv heaprin and now on [facility]  Hematology consult and eval showed no obv underlying  cause .  Hypercoagulable state screens / eval so far negative . Pancreas and biliary tract normal on mrcp . Discomfort is much better has a small amount lower near groin   no gi sx  fever bleeding . Neg fam hx of hyoercoiagulable  state and clot hx  ROS: See pertinent positives and negatives per HPI. No cp sob bleeding  new pain   Past Medical History:  Diagnosis Date   Allergy 1986   seasonal   H/O left knee surgery    MCL 2008 Dr. Hiram   History of exercise stress test 2015   exercise cardiopulmonary  test   History of varicella    Hypertension    Sleep apnea    was tested-no issues found.   Vitiligo    when younger no treatment doesn't run in families.    Family History  Problem Relation Age of Onset   Hypertension Mother    Diabetes Mellitus II Mother    Heart disease Other        maternal uncle in his 36s   Colon cancer Neg Hx    Colon polyps Neg Hx    Esophageal cancer Neg Hx    Stomach cancer Neg Hx    Rectal cancer Neg Hx     Social History   Socioeconomic History   Marital status: Married    Spouse name: Not on file   Number of children: Not on file   Years of education: Not on file   Highest education level: Master's degree (e.g., MA, MS, MEng, MEd, MSW, MBA)  Occupational History   Occupation: Surveyor, Minerals  Tobacco Use   Smoking status: Never   Smokeless tobacco: Never  Vaping Use   Vaping status: Never Used  Substance and Sexual Activity   Alcohol use: No   Drug use: No   Sexual activity: Not on file  Other Topics Concern   Not on file  Social History Narrative    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   Give ETS FA   Vegetarian but  Pos dairly and sometimes meat   Social Drivers of Health   Financial Resource Strain: Low Risk  (09/04/2023)   Overall Financial Resource Strain (CARDIA)    Difficulty of Paying Living Expenses: Not hard at all  Food Insecurity: No Food Insecurity (11/01/2023)   Hunger Vital Sign    Worried About Running Out of Food in the Last Year: Never true    Ran Out of Food in the Last Year: Never true  Transportation Needs: No Transportation Needs (11/01/2023)   PRAPARE - Administrator, Civil Service (Medical): No    Lack of Transportation (Non-Medical): No  Physical Activity: Insufficiently Active (09/04/2023)   Exercise Vital Sign    Days of  Exercise per Week: 3 days    Minutes of Exercise per Session: 40 min  Stress: No Stress Concern Present (09/04/2023)   [employer] of Occupational Health - Occupational Stress Questionnaire    Feeling of Stress: Not at all  Social Connections: Unknown (11/01/2023)   Social Connection and Isolation Panel    Frequency of Communication with Friends and Family: Once a week    Frequency of Social Gatherings with Friends and Family: Patient declined    Attends Religious Services: 1 to 4 times per year    Active Member of [employer] or Organizations: Yes    Attends Banker Meetings: 1 to 4 times per year    Marital Status: Married    Outpatient Medications Prior to Visit  Medication Sig Dispense Refill   apixaban (ELIQUIS) 5 MG TABS tablet Take 2 tablets (10 mg total) by mouth 2 (two) times daily for 7 days, THEN 1 tablet (5 mg total) 2 (two) times daily. 120 tablet 0   levocetirizine (XYZAL) 5 MG tablet Take 5 mg by mouth every evening.      losartan  (COZAAR ) 50 MG tablet TAKE 1 TABLET BY MOUTH EVERY DAY 90 tablet 3   azithromycin (ZITHROMAX) 250 MG tablet Take 250 mg by mouth See admin instructions. Patient is taking this prescription to [place] in case of infection (Patient not taking: Reported on 11/06/2023)     No facility-administered medications prior to visit.     EXAM:  BP 112/78 (BP Location: Left Arm, Patient Position: Sitting, Cuff Size: Normal)   Pulse (!) 56   Temp 97.8 F (36.6 C) (Oral)   Ht 5' 9 (1.753 m)   Wt 190 lb 9.6 oz (86.5 kg)   SpO2 96%   BMI 28.15 kg/m   Body mass index is 28.15 kg/m.  GENERAL: vitals reviewed and listed above, alert, oriented, appears well hydrated and in no acute distress HEENT: atraumatic, conjunctiva  clear, no obvious abnormalities on inspection of external nose and ears  NECK: no obvious masses on inspection palpation  LUNGS: clear to auscultation bilaterally, no wheezes, rales or rhonchi, good air movement CV: HRRR, no clubbing cyanosis or  peripheral edema nl cap refill  Abdomen:  Sof,t normal bowel sounds without hepatosplenomegaly, no guarding rebound or masses no CVA tenderness MS: moves all extremities without noticeable focal  abnormality PSYCH: pleasant and cooperative, no obvious depression or anxiety Lab Results  Component Value Date   WBC 4.5 11/05/2023   HGB 14.3 11/05/2023   HCT 44.4 11/05/2023   PLT 91 (L) 11/05/2023   GLUCOSE 87 11/03/2023   CHOL 152 09/05/2023   TRIG 102.0 09/05/2023   HDL 49.60 09/05/2023   LDLCALC 82 09/05/2023   ALT 23 11/01/2023   AST 28 11/01/2023   NA 139 11/03/2023   K 3.8 11/03/2023   CL 103 11/03/2023   CREATININE 0.95 11/03/2023   BUN 15 11/03/2023   CO2 25 11/03/2023   TSH 1.69 09/05/2023   PSA 1.44 09/05/2023   INR 1.1 11/01/2023   HGBA1C 6.3 09/05/2023   MICROALBUR <0.7 09/05/2023   BP Readings from Last 3 Encounters:  11/06/23 112/78  11/05/23 114/85  10/29/23 126/84  Hosp record revewiew  EXAM: MRCP  WITHOUT AND WITH IV CONTRAST 11/01/2023 07:05:57 PM   TECHNIQUE: Multisequence, multiplanar magnetic resonance images of the abdomen without and with intravenous contrast. MRCP sequences were performed. 9 mL Gadobutrol (GADAVIST) 1 MMOL/ML injection.   COMPARISON: CT scan 10/30/2023   CLINICAL HISTORY: Epigastric  abdominal pain  Abnormal CT scan.   FINDINGS:   LIVER: No hepatic lesions. No intrahepatic biliary dilatation.   GALLBLADDER AND BILIARY SYSTEM: Gallbladder is unremarkable. No gallstones or findings for acute cholecystitis. Normal caliber and course of the common bile duct. No intrahepatic or extrahepatic ductal dilation.   SPLEEN: The spleen is normal in size. No focal lesions.   PANCREAS/PANCREATIC DUCT: The pancreas is unremarkable. No findings for acute pancreatitis. No mass or ductal dilatation.   ADRENAL GLANDS: The adrenal glands are normal.   KIDNEYS: No renal lesions or hydronephrosis. Small bilateral renal cysts not requiring any further imaging evaluation for follow-up.   LYMPH NODES: No mesenteric or retroperitoneal mass or adenopathy.   VASCULATURE: Fairly extensive portal vein thrombosis extending into the main portal vein. Periportal collateral vessels are noted. The aorta and branch vessels are patent. No aneurysm or dissection. The major venous structures are patent except for the portal vein.   PERITONEUM: No ascites.   ABDOMINAL WALL: No abdominal wall hernia. No mass.   BOWEL: The stomach, duodenum, visualized small bowel, and visualized colon are unremarkable. No bowel obstruction.   BONES: The bony structures are unremarkable.   SOFT TISSUES: Unremarkable.   MISCELLANEOUS: The lung bases are clear. No pleural or pericardial effusion. No pulmonary infiltrates.   IMPRESSION: 1. Extensive portal vein thrombosis extending into the main portal vein with periportal collateral vessels. No obvious cause. 2. No hepatic,  bilirary or pancreatic abnormalities.   Electronically signed by: Maude Stammer MD 11/01/2023 07:34 PM EDT RP Workstation: [id number] ASSESSMENT AND PLAN:  Discussed the following assessment and plan:  Portal vein thrombosis  Hospital discharge follow-up  Medication management  Anticoagulated Doing well  idiopathic  portal l vein thrombosis  To have fu with  hematology  Currently minimal symptoms  80 + % better   If worsens call team  Otherwise  continue anticoagulation and   fu in 2-3 mos . We can  move out the liver us  doppler to   dec jan  to check on status  assuming all is stable. And use as a follow up  Med list reviewed   -Patient advised to return or notify health care team  if  new concerns arise. In interim   Patient Instructions  Good to see you today   Glad  there is no evidence of underlying  significant disease.  No obvious   liver disease either.  Continue anticoagulant and make appt with Dr Timmy and team .  Plan fu  in 2-3 months.  Or as needed.  Shawntay Prest K. Joanne Salah M.D.

## 2023-11-07 ENCOUNTER — Telehealth: Payer: Self-pay

## 2023-11-07 LAB — PROTHROMBIN GENE MUTATION

## 2023-11-07 NOTE — Telephone Encounter (Signed)
 Reach out to [facility] and spoke to [place].   Per provider, request to moved pt appt on 11/14/2023 to end December or January. Appt was set for Monday, 12/31/2023 800am.   Follow up with pt and inform him of information above. Pt reports he is doing good- is currently driving so can't look at this schedule. He states he will call if need to reschedule his appt.   Told pt this cma will send information to his mychart so he can call if needed. Pt verbalize understanding.

## 2023-11-08 LAB — FACTOR 5 LEIDEN

## 2023-11-08 LAB — PROTEIN C, TOTAL: Protein C, Total: 69 % (ref 60–150)

## 2023-11-14 ENCOUNTER — Other Ambulatory Visit

## 2023-11-20 ENCOUNTER — Inpatient Hospital Stay: Attending: Hematology & Oncology

## 2023-11-20 ENCOUNTER — Other Ambulatory Visit: Payer: Self-pay

## 2023-11-20 ENCOUNTER — Encounter: Payer: Self-pay | Admitting: Hematology & Oncology

## 2023-11-20 ENCOUNTER — Inpatient Hospital Stay (HOSPITAL_BASED_OUTPATIENT_CLINIC_OR_DEPARTMENT_OTHER): Admitting: Hematology & Oncology

## 2023-11-20 VITALS — BP 125/80 | HR 52 | Temp 98.0°F | Resp 16 | Ht 69.0 in | Wt 192.0 lb

## 2023-11-20 DIAGNOSIS — R791 Abnormal coagulation profile: Secondary | ICD-10-CM

## 2023-11-20 DIAGNOSIS — I81 Portal vein thrombosis: Secondary | ICD-10-CM

## 2023-11-20 DIAGNOSIS — Z7901 Long term (current) use of anticoagulants: Secondary | ICD-10-CM | POA: Insufficient documentation

## 2023-11-20 DIAGNOSIS — I82221 Chronic embolism and thrombosis of inferior vena cava: Secondary | ICD-10-CM

## 2023-11-20 DIAGNOSIS — Z79899 Other long term (current) drug therapy: Secondary | ICD-10-CM | POA: Insufficient documentation

## 2023-11-20 LAB — CBC WITH DIFFERENTIAL (CANCER CENTER ONLY)
Abs Immature Granulocytes: 0.02 K/uL (ref 0.00–0.07)
Basophils Absolute: 0 K/uL (ref 0.0–0.1)
Basophils Relative: 1 %
Eosinophils Absolute: 0.1 K/uL (ref 0.0–0.5)
Eosinophils Relative: 3 %
HCT: 46 % (ref 39.0–52.0)
Hemoglobin: 14.8 g/dL (ref 13.0–17.0)
Immature Granulocytes: 0 %
Lymphocytes Relative: 28 %
Lymphs Abs: 1.5 K/uL (ref 0.7–4.0)
MCH: 24.4 pg — ABNORMAL LOW (ref 26.0–34.0)
MCHC: 32.2 g/dL (ref 30.0–36.0)
MCV: 75.9 fL — ABNORMAL LOW (ref 80.0–100.0)
Monocytes Absolute: 0.6 K/uL (ref 0.1–1.0)
Monocytes Relative: 11 %
Neutro Abs: 3.1 K/uL (ref 1.7–7.7)
Neutrophils Relative %: 57 %
Platelet Count: 127 K/uL — ABNORMAL LOW (ref 150–400)
RBC: 6.06 MIL/uL — ABNORMAL HIGH (ref 4.22–5.81)
RDW: 14.8 % (ref 11.5–15.5)
WBC Count: 5.3 K/uL (ref 4.0–10.5)
nRBC: 0 % (ref 0.0–0.2)

## 2023-11-20 LAB — CMP (CANCER CENTER ONLY)
ALT: 21 U/L (ref 0–44)
AST: 21 U/L (ref 15–41)
Albumin: 4 g/dL (ref 3.5–5.0)
Alkaline Phosphatase: 72 U/L (ref 38–126)
Anion gap: 9 (ref 5–15)
BUN: 20 mg/dL (ref 8–23)
CO2: 26 mmol/L (ref 22–32)
Calcium: 9.2 mg/dL (ref 8.9–10.3)
Chloride: 103 mmol/L (ref 98–111)
Creatinine: 0.91 mg/dL (ref 0.61–1.24)
GFR, Estimated: >60 mL/min (ref >60)
Glucose, Bld: 126 mg/dL — ABNORMAL HIGH (ref 70–99)
Potassium: 4.5 mmol/L (ref 3.5–5.1)
Sodium: 138 mmol/L (ref 135–145)
Total Bilirubin: 0.9 mg/dL (ref 0.0–1.2)
Total Protein: 6.6 g/dL (ref 6.5–8.1)

## 2023-11-20 NOTE — Progress Notes (Signed)
 Hematology and Oncology Follow Up Visit  Jake Delgado [id number] 03-May-1955 68 y.o. 11/20/2023   Principle Diagnosis:  Portal vein thrombosis-possible idiopathic  Current Therapy:   Eliquis 5 mg p.o. twice daily-start on 11/03/2023     Interim History:  Jake Delgado is back for follow-up.  This is first office visit.  I saw him in consultation at [facility] when he presented with abdominal pain.  He had a portal vein thrombus that was noted.  This was evaluated thoroughly with CT scans and MRI.  He was placed on heparin.  He then was placed on Eliquis.  He has done well.  He is not have any abdominal pain.  He says on occasion down in the inguinal area, I think on the right side, there might be a little bit of dullness.  He did have some hypercoagulable studies that were done in the hospital.  So far, everything has come back unremarkable.  He did have some thrombocytopenia.  This could have been a possible consumptive issue.  His platelet count is improving which is nice to see.  I suppose the thrombocytopenia could also been may be from the heparin.  He does not have any cirrhosis.  There is no splenomegaly.  There is no adenopathy.  He was checked for malignancy.  All of his test were normal.  PSA was 1.44.  His alpha-fetoprotein was 3.3.  CA 19-9 was 9.  Again he is doing much better.  He is working.  He she has a birthday coming up next week.  There is been no cough or shortness of breath.  He has had no leg pain.  He is back to staying active.  Overall, I would have to say that his performance status is probably ECOG 1.  Medications:  Current Outpatient Medications:    apixaban (ELIQUIS) 5 MG TABS tablet, Take 2 tablets (10 mg total) by mouth 2 (two) times daily for 7 days, THEN 1 tablet (5 mg total) 2 (two) times daily., Disp: 120 tablet, Rfl: 0   levocetirizine (XYZAL) 5 MG tablet, Take 5 mg by mouth every evening., Disp: , Rfl:    losartan  (COZAAR ) 50 MG tablet, TAKE  1 TABLET BY MOUTH EVERY DAY, Disp: 90 tablet, Rfl: 3  Allergies:  Allergies  Allergen Reactions   Aspirin Other (See Comments) and Anaphylaxis   Ibuprofen Other (See Comments) and Anaphylaxis    REACTION: swelling neck and difficulty breathing   Ansaid [Flurbiprofen] Other (See Comments)    Patients wife stated that he can not take any Ansaids   Dust Mite Extract     Other Reaction(s): Not available   Atorvastatin  Other (See Comments)    MS side effects at 10 mg see notes 2023, muscle cramp    Past Medical History, Surgical history, Social history, and Family History were reviewed and updated.  Review of Systems: Review of Systems  Constitutional: Negative.   HENT:  Negative.    Eyes: Negative.   Respiratory: Negative.    Cardiovascular: Negative.   Gastrointestinal: Negative.   Endocrine: Negative.   Genitourinary: Negative.    Musculoskeletal: Negative.   Skin: Negative.   Neurological: Negative.   Hematological: Negative.   Psychiatric/Behavioral: Negative.      Physical Exam:  height is 5' 9 (1.753 m) and weight is 192 lb (87.1 kg). His oral temperature is 98 F (36.7 C). His blood pressure is 125/80 and his pulse is 52 (abnormal). His respiration is 16 and oxygen saturation is  96%.   Wt Readings from Last 3 Encounters:  11/20/23 192 lb (87.1 kg)  11/06/23 190 lb 9.6 oz (86.5 kg)  11/01/23 186 lb 4.6 oz (84.5 kg)    Physical Exam Vitals reviewed.  HENT:     Head: Normocephalic and atraumatic.  Eyes:     Pupils: Pupils are equal, round, and reactive to light.  Cardiovascular:     Rate and Rhythm: Normal rate and regular rhythm.     Heart sounds: Normal heart sounds.  Pulmonary:     Effort: Pulmonary effort is normal.     Breath sounds: Normal breath sounds.  Abdominal:     General: Bowel sounds are normal.     Palpations: Abdomen is soft.  Musculoskeletal:        General: No tenderness or deformity. Normal range of motion.     Cervical back: Normal  range of motion.  Lymphadenopathy:     Cervical: No cervical adenopathy.  Skin:    General: Skin is warm and dry.     Findings: No erythema or rash.     Comments: Skin exam does show significant vitiligo.  He has had this for several years.  Neurological:     Mental Status: He is alert and oriented to person, place, and time.  Psychiatric:        Behavior: Behavior normal.        Thought Content: Thought content normal.        Judgment: Judgment normal.      Lab Results  Component Value Date   WBC 5.3 11/20/2023   HGB 14.8 11/20/2023   HCT 46.0 11/20/2023   MCV 75.9 (L) 11/20/2023   PLT 127 (L) 11/20/2023     Chemistry      Component Value Date/Time   NA 138 11/20/2023 1055   NA 141 08/14/2018 0000   K 4.5 11/20/2023 1055   CL 103 11/20/2023 1055   CO2 26 11/20/2023 1055   BUN 20 11/20/2023 1055   BUN 17 08/14/2018 0000   CREATININE 0.91 11/20/2023 1055   CREATININE 1.01 08/12/2019 0916   GLU 86 08/14/2018 0000      Component Value Date/Time   CALCIUM  9.2 11/20/2023 1055   ALKPHOS 72 11/20/2023 1055   AST 21 11/20/2023 1055   ALT 21 11/20/2023 1055   BILITOT 0.9 11/20/2023 1055     His blood smear shows a normochromic normocytic population of red blood cells.  There are no nucleated red blood cells.  I see no teardrop cells.  He has no schistocytes.  He has no target cells.  I see no rouleaux formation.  White blood cells appear normal in morphology and maturation.  He has no hyper segmented polys.  I see no immature myeloid or lymphoid forms.  Platelets are adequate in number and size.  He may have a few large platelets.   Impression and Plan: Jake Delgado is a very nice 68 year old Indian male.  He has a portal vein thrombus.  Again, I suspect this might be idiopathic.  However, I will make sure that there is no underlying issues.  We will send off a lupus anticoagulant/antiphospholipid antibody screen.  I will also send off screening for myeloproliferative  neoplasm.  He was supposed to have had a test for PNH in the hospital.  However, I do not think this was done.  He will need to be on anticoagulation for quite a while.  This was a serious thromboembolic event.  I do  think we are going to have to repeat his scans when we see him back to see how this thrombus is improving.  I have to believe that he has much better blood flow to his mesenteric system.  He is very nice.  He has fun to talk to.  He obviously is incredibly educated.  I would like to get him back after the Holiday season.  We will see about doing our scans on him after the New Year.   Maude JONELLE Crease, MD 11/18/202511:59 AM

## 2023-11-21 LAB — LUPUS ANTICOAGULANT PANEL
DRVVT: 65 s — ABNORMAL HIGH (ref 0.0–47.0)
PTT Lupus Anticoagulant: 34.8 s (ref 0.0–43.5)

## 2023-11-21 LAB — DRVVT MIX: dRVVT Mix: 46.8 s — ABNORMAL HIGH (ref 0.0–40.4)

## 2023-11-21 LAB — DRVVT CONFIRM: dRVVT Confirm: 1.1 ratio (ref 0.8–1.2)

## 2023-11-24 LAB — CARDIOLIPIN ANTIBODIES, IGG, IGM, IGA
Anticardiolipin IgA: <9 U/mL (ref 0–11)
Anticardiolipin IgG: <9 GPL U/mL (ref 0–14)
Anticardiolipin IgM: <9 [MPL'U]/mL (ref 0–12)

## 2023-11-29 ENCOUNTER — Encounter: Payer: Self-pay | Admitting: Internal Medicine

## 2023-11-29 ENCOUNTER — Encounter (HOSPITAL_COMMUNITY): Payer: Self-pay

## 2023-11-30 ENCOUNTER — Other Ambulatory Visit (HOSPITAL_COMMUNITY): Payer: Self-pay

## 2023-12-03 LAB — NGS JAK2 E12-15/CALR/MPL

## 2023-12-10 ENCOUNTER — Ambulatory Visit

## 2023-12-10 VITALS — BP 128/60 | HR 60 | Temp 97.9°F | Ht 69.0 in | Wt 192.6 lb

## 2023-12-10 DIAGNOSIS — Z0000 Encounter for general adult medical examination without abnormal findings: Secondary | ICD-10-CM

## 2023-12-10 NOTE — Progress Notes (Signed)
 Chief Complaint  Patient presents with   Medicare Wellness     Subjective:   Jake Delgado is a 68 y.o. male who presents for a Medicare Annual Wellness Visit.  Visit info / Clinical Intake: Medicare Wellness Visit Type:: Initial Annual Wellness Visit Persons participating in visit and providing information:: patient Medicare Wellness Visit Mode:: In-person (required for WTM) Interpreter Needed?: No Pre-visit prep was completed: no AWV questionnaire completed by patient prior to visit?: no Living arrangements:: lives with spouse/significant other Patient's Overall Health Status Rating: very good Typical amount of pain: none Does pain affect daily life?: no Are you currently prescribed opioids?: no  Dietary Habits and Nutritional Risks How many meals a day?: 2 Eats fruit and vegetables daily?: yes Most meals are obtained by: preparing own meals In the last 2 weeks, have you had any of the following?: none Diabetic:: no  Functional Status Activities of Daily Living (to include ambulation/medication): Independent Ambulation: Independent with device- listed below Home Assistive Devices/Equipment: Eyeglasses Medication Administration: Independent Home Management (perform basic housework or laundry): Independent Manage your own finances?: yes Primary transportation is: driving Concerns about vision?: no *vision screening is required for WTM* Concerns about hearing?: no  Fall Screening Falls in the past year?: 0 Number of falls in past year: 0 Was there an injury with Fall?: 0 Fall Risk Category Calculator: 0 Patient Fall Risk Level: Low Fall Risk  Fall Risk Patient at Risk for Falls Due to: No Fall Risks Fall risk Follow up: Falls evaluation completed  Home and Transportation Safety: All rugs have non-skid backing?: yes All stairs or steps have railings?: yes Grab bars in the bathtub or shower?: (!) no Have non-skid surface in bathtub or shower?: yes Good home  lighting?: yes Regular seat belt use?: yes Hospital stays in the last year:: (!) yes How many hospital stays:: 1 Reason: Eval/Teasing  Cognitive Assessment Difficulty concentrating, remembering, or making decisions? : no Will 6CIT or Mini Cog be Completed: yes What year is it?: 0 points What month is it?: 0 points Give patient an address phrase to remember (5 components): [address] [place]  About what time is it?: 0 points Count backwards from 20 to 1: 0 points Say the months of the year in reverse: 0 points Repeat the address phrase from earlier: 0 points 6 CIT Score: 0 points  Advance Directives (For Healthcare) Does Patient Have a Medical Advance Directive?: Yes Does patient want to make changes to medical advance directive?: No - Guardian declined Type of Advance Directive: Healthcare Power of [place]; Living will Copy of Healthcare Power of Attorney in Chart?: No - copy requested Copy of Living Will in Chart?: No - copy requested  Reviewed/Updated  Reviewed/Updated: Reviewed All (Medical, Surgical, Family, Medications, Allergies, Care Teams, Patient Goals)    Allergies (verified) Aspirin, Ibuprofen, Ansaid [flurbiprofen], Dust mite extract, and Atorvastatin    Current Medications (verified) Outpatient Encounter Medications as of 12/10/2023  Medication Sig   apixaban  (ELIQUIS ) 5 MG TABS tablet Take 2 tablets (10 mg total) by mouth 2 (two) times daily for 7 days, THEN 1 tablet (5 mg total) 2 (two) times daily.   levocetirizine (XYZAL) 5 MG tablet Take 5 mg by mouth every evening.   losartan  (COZAAR ) 50 MG tablet TAKE 1 TABLET BY MOUTH EVERY DAY   No facility-administered encounter medications on file as of 12/10/2023.    History: Past Medical History:  Diagnosis Date   Allergy 1986   seasonal   H/O left knee  surgery    MCL 2008 Dr. Hiram   History of exercise stress test 2015   exercise cardiopulmonary  test   History of varicella    Hypertension    Sleep  apnea    was tested-no issues found.   Vitiligo    when younger no treatment doesn't run in families.   Past Surgical History:  Procedure Laterality Date   CARDIOVASCULAR STRESS TEST     COLONOSCOPY  2011   dental implants     KNEE ARTHROSCOPY     Family History  Problem Relation Age of Onset   Hypertension Mother    Diabetes Mellitus II Mother    Heart disease Other        maternal uncle in his 64s   Colon cancer Neg Hx    Colon polyps Neg Hx    Esophageal cancer Neg Hx    Stomach cancer Neg Hx    Rectal cancer Neg Hx    Social History   Occupational History   Occupation: Surveyor, Minerals  Tobacco Use   Smoking status: Never   Smokeless tobacco: Never  Vaping Use   Vaping status: Never Used  Substance and Sexual Activity   Alcohol use: No   Drug use: No   Sexual activity: Not on file   Tobacco Counseling Counseling given: No  SDOH Screenings   Food Insecurity: No Food Insecurity (12/10/2023)  Housing: Unknown (12/10/2023)  Transportation Needs: No Transportation Needs (12/10/2023)  Utilities: Not At Risk (12/10/2023)  Alcohol Screen: Low Risk  (09/05/2023)  Depression (PHQ2-9): Low Risk  (12/10/2023)  Financial Resource Strain: Low Risk  (09/04/2023)  Physical Activity: Sufficiently Active (12/10/2023)  Social Connections: Socially Integrated (12/10/2023)  Stress: No Stress Concern Present (12/10/2023)  Tobacco Use: Low Risk  (12/10/2023)  Health Literacy: Adequate Health Literacy (12/10/2023)   See flowsheets for full screening details  Depression Screen PHQ 2 & 9 Depression Scale- Over the past 2 weeks, how often have you been bothered by any of the following problems? Little interest or pleasure in doing things: 0 Feeling down, depressed, or hopeless (PHQ Adolescent also includes...irritable): 0 PHQ-2 Total Score: 0     Goals Addressed               This Visit's Progress     Increase physical activity (pt-stated)        Remain active. Get blood clot  out of my system.             Objective:    Today's Vitals   12/10/23 0959  BP: 128/60  Pulse: 60  Temp: 97.9 F (36.6 C)  TempSrc: Oral  SpO2: 93%  Weight: 192 lb 9.6 oz (87.4 kg)  Height: 5' 9 (1.753 m)   Body mass index is 28.44 kg/m.  Hearing/Vision screen Hearing Screening - Comments:: Denies hearing difficulties   Vision Screening - Comments:: Wears rx glasses - up to date with routine eye exams with  Dr Waylan Immunizations and Health Maintenance Health Maintenance  Topic Date Due   COVID-19 Vaccine (8 - 2025-26 season) 09/03/2023   DTaP/Tdap/Td (2 - Td or Tdap) 01/14/2024   Diabetic kidney evaluation - Urine ACR  09/04/2024   Diabetic kidney evaluation - eGFR measurement  11/19/2024   Medicare Annual Wellness (AWV)  12/09/2024   Colonoscopy  06/10/2029   Pneumococcal Vaccine: 50+ Years  Completed   Influenza Vaccine  Completed   Hepatitis C Screening  Completed   Zoster Vaccines- Shingrix   Completed  Meningococcal B Vaccine  Aged Out        Assessment/Plan:  This is a routine wellness examination for Jake Delgado.  Patient Care Team: [name], [name], MD as PCP - General (Internal Medicine) Darlean Ozell NOVAK, MD as Consulting Physician (Pulmonary Disease) Jordan, Amy, MD as Consulting Physician (Dermatology)  I have personally reviewed and noted the following in the patient's chart:   Medical and social history Use of alcohol, tobacco or illicit drugs  Current medications and supplements including opioid prescriptions. Functional ability and status Nutritional status Physical activity Advanced directives List of other physicians Hospitalizations, surgeries, and ER visits in previous 12 months Vitals Screenings to include cognitive, depression, and falls Referrals and appointments  No orders of the defined types were placed in this encounter.  In addition, I have reviewed and discussed with patient certain preventive protocols, quality metrics, and  best practice recommendations. A written personalized care plan for preventive services as well as general preventive health recommendations were provided to patient.   Rojelio LELON Blush, LPN   87/01/7972   Return in 1 year on 12/15/24  After Visit Summary: (In Person-Declined) Patient declined AVS at this time.  Nurse Notes: None

## 2023-12-10 NOTE — Patient Instructions (Addendum)
 Jake Delgado,  Thank you for taking the time for your Medicare Wellness Visit. I appreciate your continued commitment to your health goals. Please review the care plan we discussed, and feel free to reach out if I can assist you further.  Please note that Annual Wellness Visits do not include a physical exam. Some assessments may be limited, especially if the visit was conducted virtually. If needed, we may recommend an in-person follow-up with your provider.  Ongoing Care Seeing your primary care provider every 3 to 6 months helps us  monitor your health and provide consistent, personalized care.   Referrals If a referral was made during today's visit and you haven't received any updates within two weeks, please contact the referred provider directly to check on the status.  Recommended Screenings:  Health Maintenance  Topic Date Due   COVID-19 Vaccine (8 - 2025-26 season) 09/03/2023   DTaP/Tdap/Td vaccine (2 - Td or Tdap) 01/14/2024   Yearly kidney health urinalysis for diabetes  09/04/2024   Yearly kidney function blood test for diabetes  11/19/2024   Medicare Annual Wellness Visit  12/09/2024   Colon Cancer Screening  06/10/2029   Pneumococcal Vaccine for age over 68  Completed   Flu Shot  Completed   Hepatitis C Screening  Completed   Zoster (Shingles) Vaccine  Completed   Meningitis B Vaccine  Aged Out       12/10/2023   10:09 AM  Advanced Directives  Does Patient Have a Medical Advance Directive? Yes  Type of Estate Agent of [place];Living will  Does patient want to make changes to medical advance directive? No - Guardian declined  Copy of Healthcare Power of Attorney in Chart? No - copy requested    Vision: Annual vision screenings are recommended for early detection of glaucoma, cataracts, and diabetic retinopathy. These exams can also reveal signs of chronic conditions such as diabetes and high blood pressure.  Dental: Annual dental screenings help  detect early signs of oral cancer, gum disease, and other conditions linked to overall health, including heart disease and diabetes.  Please see the attached documents for additional preventive care recommendations.

## 2023-12-22 ENCOUNTER — Other Ambulatory Visit (HOSPITAL_COMMUNITY): Payer: Self-pay

## 2023-12-22 ENCOUNTER — Encounter: Payer: Self-pay | Admitting: Hematology & Oncology

## 2023-12-24 ENCOUNTER — Other Ambulatory Visit: Payer: Self-pay | Admitting: *Deleted

## 2023-12-24 ENCOUNTER — Other Ambulatory Visit (HOSPITAL_COMMUNITY): Payer: Self-pay

## 2023-12-24 MED ORDER — APIXABAN 5 MG PO TABS
5.0000 mg | ORAL_TABLET | Freq: Two times a day (BID) | ORAL | 3 refills | Status: AC
  Filled 2023-12-24: qty 60, 30d supply, fill #0
  Filled 2024-01-18: qty 60, 30d supply, fill #1

## 2023-12-31 ENCOUNTER — Ambulatory Visit
Admission: RE | Admit: 2023-12-31 | Discharge: 2023-12-31 | Disposition: A | Discharge status: Home / Self Care | Source: Ambulatory Visit | Attending: Internal Medicine | Admitting: Internal Medicine

## 2024-01-10 ENCOUNTER — Ambulatory Visit (HOSPITAL_COMMUNITY)
Admission: RE | Admit: 2024-01-10 | Discharge: 2024-01-10 | Disposition: A | Discharge status: Home / Self Care | Source: Ambulatory Visit | Attending: Hematology & Oncology | Admitting: Hematology & Oncology

## 2024-01-10 ENCOUNTER — Ambulatory Visit: Payer: Self-pay | Admitting: Hematology & Oncology

## 2024-01-10 ENCOUNTER — Other Ambulatory Visit: Payer: Self-pay | Admitting: Hematology & Oncology

## 2024-01-10 DIAGNOSIS — I81 Portal vein thrombosis: Secondary | ICD-10-CM | POA: Diagnosis present

## 2024-01-10 DIAGNOSIS — R791 Abnormal coagulation profile: Secondary | ICD-10-CM

## 2024-01-10 DIAGNOSIS — I82221 Chronic embolism and thrombosis of inferior vena cava: Secondary | ICD-10-CM | POA: Diagnosis present

## 2024-01-10 MED ORDER — GADOBUTROL 1 MMOL/ML IV SOLN
8.0000 mL | Freq: Once | INTRAVENOUS | Status: AC | PRN
  Administered 2024-01-10: 8 mL via INTRAVENOUS

## 2024-01-15 ENCOUNTER — Ambulatory Visit: Payer: Self-pay | Admitting: Hematology & Oncology

## 2024-01-17 ENCOUNTER — Ambulatory Visit: Admitting: Internal Medicine

## 2024-01-17 ENCOUNTER — Encounter: Payer: Self-pay | Admitting: Internal Medicine

## 2024-01-17 VITALS — BP 130/86 | HR 52 | Temp 97.9°F | Ht 69.0 in | Wt 195.8 lb

## 2024-01-17 DIAGNOSIS — I81 Portal vein thrombosis: Secondary | ICD-10-CM | POA: Diagnosis not present

## 2024-01-17 DIAGNOSIS — Z7901 Long term (current) use of anticoagulants: Secondary | ICD-10-CM | POA: Diagnosis not present

## 2024-01-17 DIAGNOSIS — Z79899 Other long term (current) drug therapy: Secondary | ICD-10-CM | POA: Diagnosis not present

## 2024-01-17 NOTE — Progress Notes (Signed)
 "  Chief Complaint  Patient presents with   Medical Management of Chronic Issues    Pt is here for his f/u. Pt reports no pain or discomfort on abdomen. Feels normal. Would like to discuss about her Eliquis .     HPI: Jake Delgado 69 y.o. come in for  fu  for portal vein thrombosis  form October  feels  well and on anticogaulation  .   No sx at all at present  had su Dr Timmy   tomorrow and had recent MTI  Neg fam hx . To see dr Tommas next week and eating  healthier . Since event . BP managing . SABRA  ROS: See pertinent positives and negatives per HPI. No bleeding  pain and nl exercise tolerance  Past Medical History:  Diagnosis Date   Allergy 1986   seasonal   H/O left knee surgery    MCL 2008 Dr. Hiram   History of exercise stress test 2015   exercise cardiopulmonary  test   History of varicella    Hypertension    Sleep apnea    was tested-no issues found.   Vitiligo    when younger no treatment doesn't run in families.    Family History  Problem Relation Age of Onset   Hypertension Mother    Diabetes Mellitus II Mother    Heart disease Other        maternal uncle in his 78s   Colon cancer Neg Hx    Colon polyps Neg Hx    Esophageal cancer Neg Hx    Stomach cancer Neg Hx    Rectal cancer Neg Hx     Social History   Socioeconomic History   Marital status: Married    Spouse name: Not on file   Number of children: Not on file   Years of education: Not on file   Highest education level: Master's degree (e.g., MA, MS, MEng, MEd, MSW, MBA)  Occupational History   Occupation: Surveyor, [employer]  Tobacco Use   Smoking status: Never   Smokeless tobacco: Never  Vaping Use   Vaping status: Never Used  Substance and Sexual Activity   Alcohol use: No   Drug use: No   Sexual activity: Not on file  Other Topics Concern   Not on file  Social History Narrative   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   Give ETS FA   Vegetarian but  Pos dairly and sometimes meat   Social Drivers of Health   Tobacco Use: Low Risk (01/17/2024)   Patient History    Smoking Tobacco Use: Never    Smokeless Tobacco Use: Never    Passive Exposure: Not on file  Financial Resource Strain: Low Risk (01/14/2024)   Overall Financial Resource Strain (CARDIA)    Difficulty of Paying Living Expenses: Not hard at all  Food Insecurity: No Food Insecurity (01/14/2024)   Epic    Worried About Programme Researcher, Broadcasting/film/video in the Last Year: Never true    Ran Out of Food in the Last Year: Never true  Transportation Needs: No Transportation Needs (01/14/2024)   Epic    Lack of Transportation (Medical): No    Lack of Transportation (  Non-Medical): No  Physical Activity: Sufficiently Active (01/14/2024)   Exercise Vital Sign    Days of Exercise per Week: 5 days    Minutes of Exercise per Session: 40 min  Stress: No Stress Concern Present (01/14/2024)   [employer] of Occupational Health - Occupational Stress Questionnaire    Feeling of Stress: Not at all  Social Connections: Socially Integrated (01/14/2024)   Social Connection and Isolation Panel    Frequency of Communication with Friends and Family: More than three times a week    Frequency of Social Gatherings with Friends and Family: Once a week    Attends Religious Services: 1 to 4 times per year    Active Member of Clubs or Organizations: Yes    Attends Banker Meetings: More than 4 times per year    Marital Status: Married  Depression (PHQ2-9): Low Risk (12/10/2023)   Depression (PHQ2-9)    PHQ-2 Score: 0  Alcohol Screen: Low Risk (09/05/2023)   Alcohol Screen    Last Alcohol Screening Score (AUDIT): 0  Housing: Unknown (01/14/2024)   Epic    Unable to Pay for Housing in the Last Year: No     Number of Times Moved in the Last Year: Not on file    Homeless in the Last Year: No  Utilities: Not At Risk (12/10/2023)   Epic    Threatened with loss of utilities: No  Health Literacy: Adequate Health Literacy (12/10/2023)   B1300 Health Literacy    Frequency of need for help with medical instructions: Never    Outpatient Medications Prior to Visit  Medication Sig Dispense Refill   apixaban  (ELIQUIS ) 5 MG TABS tablet Take 1 tablet (5 mg total) by mouth 2 (two) times daily. 60 tablet 3   levocetirizine (XYZAL) 5 MG tablet Take 5 mg by mouth every evening.     losartan  (COZAAR ) 50 MG tablet TAKE 1 TABLET BY MOUTH EVERY DAY 90 tablet 3   No facility-administered medications prior to visit.     EXAM:  BP 130/86 (BP Location: Left Arm, Patient Position: Sitting, Cuff Size: Large)   Pulse (!) 52   Temp 97.9 F (36.6 C) (Oral)   Ht 5' 9 (1.753 m)   Wt 195 lb 12.8 oz (88.8 kg)   SpO2 97%   BMI 28.91 kg/m   Body mass index is 28.91 kg/m.  GENERAL: vitals reviewed and listed above, alert, oriented, appears well hydrated and in no acute distress HEENT: atraumatic, conjunctiva  clear, no obvious abnormalities on inspection of external nose and ears OP : no lesion edema or exudate  NECK: no obvious masses on inspection palpation  LUNGS: clear to auscultation bilaterally, no wheezes, rales or rhonchi, good air movement CV: HRRR, no clubbing cyanosis or  peripheral edema nl cap refill  MS: moves all extremities without noticeable focal  abnormality PSYCH: pleasant and cooperative, no obvious depression or anxiety Lab Results  Component Value Date   WBC 5.3 11/20/2023   HGB 14.8 11/20/2023   HCT 46.0 11/20/2023   PLT 127 (L) 11/20/2023   GLUCOSE 126 (H) 11/20/2023   CHOL 152 09/05/2023   TRIG 102.0 09/05/2023   HDL 49.60 09/05/2023   LDLCALC 82 09/05/2023   ALT 21 11/20/2023   AST 21 11/20/2023   NA 138 11/20/2023   K 4.5 11/20/2023   CL 103 11/20/2023   CREATININE 0.91  11/20/2023   BUN 20 11/20/2023   CO2 26 11/20/2023   TSH 1.69 09/05/2023  PSA 1.44 09/05/2023   INR 1.1 11/01/2023   HGBA1C 6.3 09/05/2023   MICROALBUR <0.7 09/05/2023   BP Readings from Last 3 Encounters:  01/17/24 130/86  12/10/23 128/60  11/20/23 125/80  Mri  noted   and us   cavernous  findings no acute obstruction  mention of small varices   ASSESSMENT AND PLAN:  Discussed the following assessment and plan:  Portal vein thrombosis  Medication management  Anticoagulated  Will have hematology  Dr Timmy manage condition  Doing very well and no bleeding on eliquis .  Usually on anticoag minimal 6 mos and longer depending on potential causes  ( idiopathic?) Dr Tommas will fu  his glucose intolerance  Reveiwed immunizations  can get tdap at pharmacy if needed. Thinks he had HEP B  and utd  on immunization.   Can decide on covid booster  input from hematology.   -Patient advised to return or notify health care team  if  new concerns arise.  There are no Patient Instructions on file for this visit.   Roman Dubuc K. Reyanna Baley M.D. "

## 2024-01-18 ENCOUNTER — Inpatient Hospital Stay: Admitting: Hematology & Oncology

## 2024-01-18 ENCOUNTER — Encounter: Payer: Self-pay | Admitting: Hematology & Oncology

## 2024-01-18 ENCOUNTER — Inpatient Hospital Stay: Attending: Hematology & Oncology

## 2024-01-18 VITALS — BP 144/82 | HR 50 | Temp 97.6°F | Resp 20 | Ht 69.0 in | Wt 196.0 lb

## 2024-01-18 DIAGNOSIS — I81 Portal vein thrombosis: Secondary | ICD-10-CM | POA: Insufficient documentation

## 2024-01-18 DIAGNOSIS — R791 Abnormal coagulation profile: Secondary | ICD-10-CM

## 2024-01-18 DIAGNOSIS — Z7901 Long term (current) use of anticoagulants: Secondary | ICD-10-CM | POA: Insufficient documentation

## 2024-01-18 DIAGNOSIS — I82221 Chronic embolism and thrombosis of inferior vena cava: Secondary | ICD-10-CM

## 2024-01-18 DIAGNOSIS — Z79899 Other long term (current) drug therapy: Secondary | ICD-10-CM | POA: Insufficient documentation

## 2024-01-18 LAB — CMP (CANCER CENTER ONLY)
ALT: 19 U/L (ref 0–44)
AST: 20 U/L (ref 15–41)
Albumin: 4.2 g/dL (ref 3.5–5.0)
Alkaline Phosphatase: 88 U/L (ref 38–126)
Anion gap: 6 (ref 5–15)
BUN: 23 mg/dL (ref 8–23)
CO2: 29 mmol/L (ref 22–32)
Calcium: 9.2 mg/dL (ref 8.9–10.3)
Chloride: 104 mmol/L (ref 98–111)
Creatinine: 0.93 mg/dL (ref 0.61–1.24)
GFR, Estimated: >60 mL/min
Glucose, Bld: 112 mg/dL — ABNORMAL HIGH (ref 70–99)
Potassium: 5 mmol/L (ref 3.5–5.1)
Sodium: 139 mmol/L (ref 135–145)
Total Bilirubin: 0.7 mg/dL (ref 0.0–1.2)
Total Protein: 7.2 g/dL (ref 6.5–8.1)

## 2024-01-18 LAB — CBC WITH DIFFERENTIAL (CANCER CENTER ONLY)
Abs Immature Granulocytes: 0.03 K/uL (ref 0.00–0.07)
Basophils Absolute: 0 K/uL (ref 0.0–0.1)
Basophils Relative: 1 %
Eosinophils Absolute: 0.1 K/uL (ref 0.0–0.5)
Eosinophils Relative: 3 %
HCT: 47.2 % (ref 39.0–52.0)
Hemoglobin: 15.3 g/dL (ref 13.0–17.0)
Immature Granulocytes: 1 %
Lymphocytes Relative: 29 %
Lymphs Abs: 1.3 K/uL (ref 0.7–4.0)
MCH: 24.7 pg — ABNORMAL LOW (ref 26.0–34.0)
MCHC: 32.4 g/dL (ref 30.0–36.0)
MCV: 76.3 fL — ABNORMAL LOW (ref 80.0–100.0)
Monocytes Absolute: 0.6 K/uL (ref 0.1–1.0)
Monocytes Relative: 13 %
Neutro Abs: 2.5 K/uL (ref 1.7–7.7)
Neutrophils Relative %: 53 %
Platelet Count: 103 K/uL — ABNORMAL LOW (ref 150–400)
RBC: 6.19 MIL/uL — ABNORMAL HIGH (ref 4.22–5.81)
RDW: 15.5 % (ref 11.5–15.5)
WBC Count: 4.7 K/uL (ref 4.0–10.5)
nRBC: 0 % (ref 0.0–0.2)

## 2024-01-18 NOTE — Progress Notes (Signed)
 BP remains elevated, 147/86, patient monitors at home and will continue to do so.  Instructed to notify PCP if it remains over 140/90. Verbalized understanding.

## 2024-01-18 NOTE — Progress Notes (Signed)
 " Hematology and Oncology Follow Up Visit  Ohm Dentler [id number] [birth year] 69 y.o. 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 twice daily-start on 11/03/2023     Interim History:  Mr. Dyment is back for follow-up.  So far, everything is going quite well for him.  We did do an MRI back on 01/10/2024.  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  He has small varices throughout the upper abdomen.  He has had no abdominal pain.  There is been no problems with bowels or bladder.  He has had no nausea or vomiting.  There is been no fever.  He has had no leg swelling.  He has had no chest pain.  So far, we have not found any underlying hematologic issue that had triggered this.  There is no evidence of cirrhosis.  There is no adenopathy.  All of his thrombophilic studies have come back normal.  He has been traveling.  He has an incredibly interesting job.  He has had no problems eating.  Overall, I will say his performance status is probably ECOG 0.     Medications:  Current Outpatient Medications:    apixaban  (ELIQUIS ) 5 MG TABS tablet, Take 1 tablet (5 mg total) by mouth 2 (two) times daily., Disp: 60 tablet, Rfl: 3   levocetirizine (XYZAL) 5 MG tablet, Take 5 mg by mouth every evening., Disp: , Rfl:    losartan  (COZAAR ) 50 MG tablet, TAKE 1 TABLET BY MOUTH EVERY DAY, Disp: 90 tablet, Rfl: 3  Allergies:  Allergies  Allergen Reactions   Aspirin Other (See Comments) and Anaphylaxis   Ibuprofen Other (See Comments) and Anaphylaxis    REACTION: swelling neck and difficulty breathing   Ansaid [Flurbiprofen] Other (See Comments)    Patients wife stated that he can not take any Ansaids   Dust Mite Extract     Other Reaction(s): Not available   Atorvastatin  Other (See Comments)    MS side effects at 10 mg see notes 2023, muscle cramp    Past Medical History, Surgical history, Social  history, and Family History were reviewed and updated.  Review of Systems: Review of Systems  Constitutional: Negative.   HENT:  Negative.    Eyes: Negative.   Respiratory: Negative.    Cardiovascular: Negative.   Gastrointestinal: Negative.   Endocrine: Negative.   Genitourinary: Negative.    Musculoskeletal: Negative.   Skin: Negative.   Neurological: Negative.   Hematological: Negative.   Psychiatric/Behavioral: Negative.      Physical Exam:  height is 5' 9 (1.753 m) and weight is 196 lb 0.6 oz (88.9 kg). His oral temperature is 97.6 F (36.4 C). His blood pressure is 144/82 (abnormal) and his pulse is 50 (abnormal). His respiration is 20 and oxygen saturation is 96%.   Wt Readings from Last 3 Encounters:  01/18/24 196 lb 0.6 oz (88.9 kg)  01/17/24 195 lb 12.8 oz (88.8 kg)  12/10/23 192 lb 9.6 oz (87.4 kg)    Physical Exam Vitals reviewed.  HENT:     Head: Normocephalic and atraumatic.  Eyes:     Pupils: Pupils are equal, round, and reactive to light.  Cardiovascular:     Rate and Rhythm: Normal rate and regular rhythm.     Heart sounds: Normal heart sounds.  Pulmonary:     Effort: Pulmonary effort is normal.     Breath sounds: Normal breath sounds.  Abdominal:     General: Bowel sounds are normal.     Palpations: Abdomen is soft.  Musculoskeletal:        General: No tenderness or deformity. Normal range of motion.     Cervical back: Normal range of motion.  Lymphadenopathy:     Cervical: No cervical adenopathy.  Skin:    General: Skin is warm and dry.     Findings: No erythema or rash.     Comments: Skin exam does show significant vitiligo.  He has had this for several years.  Neurological:     Mental Status: He is alert and oriented to person, place, and time.  Psychiatric:        Behavior: Behavior normal.        Thought Content: Thought content normal.        Judgment: Judgment normal.      Lab Results  Component Value Date   WBC 4.7 01/18/2024    HGB 15.3 01/18/2024   HCT 47.2 01/18/2024   MCV 76.3 (L) 01/18/2024   PLT 103 (L) 01/18/2024     Chemistry      Component Value Date/Time   NA 139 01/18/2024 0921   NA 141 08/14/2018 0000   K 5.0 01/18/2024 0921   CL 104 01/18/2024 0921   CO2 29 01/18/2024 0921   BUN 23 01/18/2024 0921   BUN 17 08/14/2018 0000   CREATININE 0.93 01/18/2024 0921   CREATININE 1.01 08/12/2019 0916   GLU 86 08/14/2018 0000      Component Value Date/Time   CALCIUM  9.2 01/18/2024 0921   ALKPHOS 88 01/18/2024 0921   AST 20 01/18/2024 0921   ALT 19 01/18/2024 0921   BILITOT 0.7 01/18/2024 0921     His blood smear shows a normochromic normocytic population of red blood cells.  There are no nucleated red blood cells.  I see no teardrop cells.  He has no schistocytes.  He has no target cells.  I see no rouleaux formation.  White blood cells appear normal in morphology and maturation.  He has no hyper segmented polys.  I see no immature myeloid or lymphoid forms.  Platelets are adequate in number and size.  He may have a few large platelets.   Impression and Plan: Mr. Walth is a very nice 69 year old Indian male.  He has a portal vein thrombus.  Again, I suspect this might be idiopathic.  So far, all of his thrombophilic studies have been negative.  We are doing a P[facility] test on him today.  For right now, we will keep him on therapeutic Eliquis .  I will keep him on this for a year and then we would probably would keep him on Maness afterwards.  I will plan to see him back in 3 months.  I do not see that we have to do any scans on him right now.    Maude JONELLE Crease, MD 1/16/202610:36 AM "

## 2024-02-06 LAB — PNH PROFILE (-HIGH SENSITIVITY)

## 2024-02-07 ENCOUNTER — Other Ambulatory Visit: Payer: Self-pay | Admitting: Gastroenterology

## 2024-02-15 ENCOUNTER — Ambulatory Visit (HOSPITAL_COMMUNITY): Admit: 2024-02-15 | Admitting: Gastroenterology

## 2024-02-15 ENCOUNTER — Encounter (HOSPITAL_COMMUNITY): Payer: Self-pay

## 2024-04-28 ENCOUNTER — Inpatient Hospital Stay: Admitting: Hematology & Oncology

## 2024-04-28 ENCOUNTER — Inpatient Hospital Stay

## 2024-09-09 ENCOUNTER — Encounter: Admitting: Internal Medicine

## 2024-12-15 ENCOUNTER — Ambulatory Visit
